# Patient Record
Sex: Female | Born: 1955 | Race: Black or African American | Hispanic: No | Marital: Single | Smoking: Never smoker
Health system: Southern US, Community
[De-identification: ages and names within clinical notes are randomized; demographics above are authoritative.]

## PROBLEM LIST (undated history)

## (undated) DIAGNOSIS — G629 Polyneuropathy, unspecified: Secondary | ICD-10-CM

## (undated) DIAGNOSIS — K859 Acute pancreatitis without necrosis or infection, unspecified: Secondary | ICD-10-CM

## (undated) DIAGNOSIS — I639 Cerebral infarction, unspecified: Secondary | ICD-10-CM

## (undated) DIAGNOSIS — E7800 Pure hypercholesterolemia, unspecified: Secondary | ICD-10-CM

## (undated) DIAGNOSIS — K219 Gastro-esophageal reflux disease without esophagitis: Secondary | ICD-10-CM

## (undated) DIAGNOSIS — E0590 Thyrotoxicosis, unspecified without thyrotoxic crisis or storm: Secondary | ICD-10-CM

## (undated) DIAGNOSIS — E119 Type 2 diabetes mellitus without complications: Secondary | ICD-10-CM

## (undated) HISTORY — PX: ABDOMINAL HYSTERECTOMY: SHX81

## (undated) HISTORY — PX: ABDOMINAL SURGERY: SHX537

## (undated) HISTORY — PX: TONSILLECTOMY: SUR1361

## (undated) NOTE — *Deleted (*Deleted)
   S:     No chief complaint on file.   Endocrinology provider: Spenser Beasley, FNP(upcoming appt 09/05/2020 11:45 AM)  Patient presents today with *** for tandem t:slim X2 insulin pump training. PMH significant for T1DM and eczema. Patient is currently using Dexcom G6 CGM. Patient reports taking Lantus 20 units andNovolog120/30/8plan. Basal injection was last admnistered ***. Patient {Reports/denies:60888} issues obtaining insulin pump from DME.  Update school care plan Dexcom - DME or pharmacy?  Insurance: Medicaid (Managed Medicaid ? ***)  DME Supplier: *** ?  Pump Settings *** Basal rates (max: *** unit/hr) 12a-12p 0.8 unit/hr  Carb Ratio (max: *** units) 12a-12p 8 g   Correction Factor Ratio 12a-12p 30 mg/dL   Target BG 12a-12p 120 mg/dL  Tandem T:Slim X2 Insulin Pump Education Training Please refer to Insulin Pump Training Checklist scanned into media  T:Connect Account: ***  Assessment: Parents appeared to have sufficient understanding of subjects discussed during Tandem t:slim X2 insulin pump training appt. Tandem t:slim X2 Insulin pump applied successfully to ***. Insulin pump was synced with Dexcom G6 CGM to use Control IQ technology***.   Plan: 1. Tandem T:Slim X2 Insulin Pump  a. Continue to wear Tandem T:Slim insulin pump and change infusion set site every 3 days b. Patient will have a temp basal rate set until *** c. Faxed central training invoice and insulin pump training checklist to [employer] 2. School: a. Updated school care plan to state patient is using Tandem insulin pump 3. Follow Up:  a. ***  Written patient instructions provided.    This appointment required *** minutes of patient care (this includes precharting, chart review, review of results, face-to-face care, etc.).  Thank you for involving clinical pharmacist/diabetes educator to assist in providing this patient's care.  Mary Taylor, PharmD, CPP  

---

## 2015-03-09 ENCOUNTER — Emergency Department (HOSPITAL_BASED_OUTPATIENT_CLINIC_OR_DEPARTMENT_OTHER)
Admission: EM | Admit: 2015-03-09 | Discharge: 2015-03-09 | Disposition: A | Discharge status: Home / Self Care | Payer: Self-pay | Attending: Emergency Medicine | Admitting: Emergency Medicine

## 2015-03-09 ENCOUNTER — Encounter (HOSPITAL_BASED_OUTPATIENT_CLINIC_OR_DEPARTMENT_OTHER): Payer: Self-pay | Admitting: *Deleted

## 2015-03-09 DIAGNOSIS — Z79899 Other long term (current) drug therapy: Secondary | ICD-10-CM | POA: Insufficient documentation

## 2015-03-09 DIAGNOSIS — Y939 Activity, unspecified: Secondary | ICD-10-CM | POA: Insufficient documentation

## 2015-03-09 DIAGNOSIS — N898 Other specified noninflammatory disorders of vagina: Secondary | ICD-10-CM | POA: Insufficient documentation

## 2015-03-09 DIAGNOSIS — Z880 Allergy status to penicillin: Secondary | ICD-10-CM | POA: Insufficient documentation

## 2015-03-09 DIAGNOSIS — L03115 Cellulitis of right lower limb: Secondary | ICD-10-CM | POA: Insufficient documentation

## 2015-03-09 DIAGNOSIS — Y999 Unspecified external cause status: Secondary | ICD-10-CM | POA: Insufficient documentation

## 2015-03-09 DIAGNOSIS — S91104A Unspecified open wound of right lesser toe(s) without damage to nail, initial encounter: Secondary | ICD-10-CM | POA: Insufficient documentation

## 2015-03-09 DIAGNOSIS — Y929 Unspecified place or not applicable: Secondary | ICD-10-CM | POA: Insufficient documentation

## 2015-03-09 DIAGNOSIS — S91119A Laceration without foreign body of unspecified toe without damage to nail, initial encounter: Secondary | ICD-10-CM | POA: Insufficient documentation

## 2015-03-09 DIAGNOSIS — Z8669 Personal history of other diseases of the nervous system and sense organs: Secondary | ICD-10-CM | POA: Insufficient documentation

## 2015-03-09 DIAGNOSIS — Z8719 Personal history of other diseases of the digestive system: Secondary | ICD-10-CM | POA: Insufficient documentation

## 2015-03-09 DIAGNOSIS — X58XXXA Exposure to other specified factors, initial encounter: Secondary | ICD-10-CM | POA: Insufficient documentation

## 2015-03-09 DIAGNOSIS — S91109A Unspecified open wound of unspecified toe(s) without damage to nail, initial encounter: Secondary | ICD-10-CM

## 2015-03-09 HISTORY — DX: Polyneuropathy, unspecified: G62.9

## 2015-03-09 HISTORY — DX: Gastro-esophageal reflux disease without esophagitis: K21.9

## 2015-03-09 LAB — CBG MONITORING, ED: Glucose-Capillary: 129 mg/dL — ABNORMAL HIGH (ref 70–99)

## 2015-03-09 MED ORDER — DOXYCYCLINE HYCLATE 100 MG PO CAPS
100.0000 mg | ORAL_CAPSULE | Freq: Two times a day (BID) | ORAL | Status: DC
Start: 2015-03-09 — End: 2015-03-09

## 2015-03-09 MED ORDER — FLUCONAZOLE 50 MG PO TABS
150.0000 mg | ORAL_TABLET | Freq: Once | ORAL | Status: AC
  Administered 2015-03-09: 150 mg via ORAL
  Filled 2015-03-09 (×2): qty 1

## 2015-03-09 MED ORDER — DOXYCYCLINE HYCLATE 100 MG PO CAPS: 100.0000 mg | ORAL_CAPSULE | Freq: Two times a day (BID) | ORAL | Status: DC

## 2015-03-09 MED ORDER — FLUCONAZOLE 150 MG PO TABS: ORAL_TABLET | ORAL | Status: DC

## 2015-03-09 NOTE — Discharge Instructions (Signed)
Keep your foot elevated. Apply antibiotic ointment to the laceration between her toes. Keep that area dry. Take doxycycline as prescribed until all gone. Follow-up with your doctor in 2-3 days for recheck. Return if rapidly worsening.   Cellulitis Cellulitis is an infection of the skin and the tissue beneath it. The infected area is usually red and tender. Cellulitis occurs most often in the arms and lower legs.  CAUSES  Cellulitis is caused by bacteria that enter the skin through cracks or cuts in the skin. The most common types of bacteria that cause cellulitis are staphylococci and streptococci. SIGNS AND SYMPTOMS   Redness and warmth.  Swelling.  Tenderness or pain.  Fever. DIAGNOSIS  Your health care provider can usually determine what is wrong based on a physical exam. Blood tests may also be done. TREATMENT  Treatment usually involves taking an antibiotic medicine. HOME CARE INSTRUCTIONS   Take your antibiotic medicine as directed by your health care provider. Finish the antibiotic even if you start to feel better.  Keep the infected arm or leg elevated to reduce swelling.  Apply a warm cloth to the affected area up to 4 times per day to relieve pain.  Take medicines only as directed by your health care provider.  Keep all follow-up visits as directed by your health care provider. SEEK MEDICAL CARE IF:   You notice red streaks coming from the infected area.  Your red area gets larger or turns dark in color.  Your bone or joint underneath the infected area becomes painful after the skin has healed.  Your infection returns in the same area or another area.  You notice a swollen bump in the infected area.  You develop new symptoms.  You have a fever. SEEK IMMEDIATE MEDICAL CARE IF:   You feel very sleepy.  You develop vomiting or diarrhea.  You have a general ill feeling (malaise) with muscle aches and pains. MAKE SURE YOU:   Understand these  instructions.  Will watch your condition.  Will get help right away if you are not doing well or get worse. Document Released: 08/06/2005 Document Revised: 03/13/2014 Document Reviewed: 01/12/2012 [facility] Patient Information 2015 [place], [place]. This information is not intended to replace advice given to you by your health care provider. Make sure you discuss any questions you have with your health care provider.

## 2015-03-09 NOTE — ED Provider Notes (Signed)
CSN: [id number]     Arrival date & time 03/09/15  1249 History   First MD Initiated Contact with Patient 03/09/15 1259     Chief Complaint  Patient presents with  . Toe Pain     (Consider location/radiation/quality/duration/timing/severity/associated sxs/prior Treatment) HPI Selena Long is a 59 y.o. female with hx of folliculitis, presents to ED with complaint of pain between her fourth and fifth toes and swelling to the foot. Patient states symptoms started approximately 3 days ago. States she noticed a cut between her toes. She has been putting more starch powder to the area. States today the foot swelling worsened and she is having pain to the foot and with walking. She denies any fever or chills. She states she is prediabetic, not on any diabetes medications currently. She denies any other associated symptoms. She denies any injuries. She denies any pain in the calf or upper leg. She denies any recent travel or surgeries. Patient also complaining of vaginal itching. She states she recently was treated for folliculitis with doxycycline, states she thinks she has ceased infection. She denies any pain. She admits to white vaginal discharge.   Past Medical History  Diagnosis Date  . Neuropathy   . Acid reflux    Past Surgical History  Procedure Laterality Date  . Abdominal hysterectomy    . Abdominal surgery    . Tonsillectomy     No family history on file. History  Substance Use Topics  . Smoking status: Never Smoker   . Smokeless tobacco: Not on file  . Alcohol Use: No   OB History    No data available     Review of Systems  Constitutional: Negative for fever and chills.  Respiratory: Negative for cough, chest tightness and shortness of breath.   Cardiovascular: Negative for chest pain, palpitations and leg swelling.  Genitourinary: Positive for vaginal discharge. Negative for dysuria, flank pain and pelvic pain.  Musculoskeletal: Positive for myalgias, joint swelling and  arthralgias. Negative for neck pain and neck stiffness.  Skin: Positive for wound. Negative for rash.  Neurological: Negative for dizziness, weakness and headaches.  All other systems reviewed and are negative.     Allergies  Penicillins  Home Medications   Prior to Admission medications   Medication Sig Start Date End Date Taking? Authorizing Provider  cyclobenzaprine (FLEXERIL) 10 MG tablet Take 10 mg by mouth 3 (three) times daily as needed for muscle spasms.   Yes Historical Provider, MD  docusate sodium (COLACE) 50 MG capsule Take 50 mg by mouth 2 (two) times daily.   Yes Historical Provider, MD  gabapentin (NEURONTIN) 800 MG tablet Take 800 mg by mouth 3 (three) times daily.   Yes Historical Provider, MD   BP 129/61 mmHg  Pulse 80  Temp(Src) 98.7 F (37.1 C) (Oral)  Resp 18  Ht  (1.549 m)  Wt 190 lb (86.183 kg)  BMI 35.92 kg/m2  SpO2 100% Physical Exam  Constitutional: She is oriented to person, place, and time. She appears well-developed and well-nourished. No distress.  HENT:  Head: Normocephalic.  Eyes: Conjunctivae are normal.  Neck: Neck supple.  Cardiovascular: Normal rate, regular rhythm and normal heart sounds.   Pulmonary/Chest: Effort normal and breath sounds normal. No respiratory distress. She has no wheezes. She has no rales.  Abdominal: Soft. Bowel sounds are normal. She exhibits no distension. There is no tenderness. There is no rebound.  Musculoskeletal: She exhibits no edema.  Swelling noted to the dorsal right foot.  There is tenderness to palpation over lateral dorsal foot mainly over fourth and fifth metatarsals. There is a small, 1 cm laceration between fourth and fifth toe. There is macerated, moist. No drainage. Foot is mildly erythematous and warm to the touch. Full range of motion of all toes at all joints. Ankles nontender with full range of motion.  Neurological: She is alert and oriented to person, place, and time.  Skin: Skin is warm and  dry.  Psychiatric: She has a normal mood and affect. Her behavior is normal.  Nursing note and vitals reviewed.   ED Course  Procedures (including critical care time) Labs Review Labs Reviewed  CBG MONITORING, ED - Abnormal; Notable for the following:    Glucose-Capillary 129 (*)    All other components within normal limits    Imaging Review No results found.   EKG Interpretation None      MDM   Final diagnoses:  Cellulitis of right foot  Wound, open, toe, initial encounter    Patient with a small wound to the right foot between fourth and fifth toes, most likely the source of foot cellulitis. She is afebrile, nontoxic appearing, no injuries to the foot. She is prediabetic, CBG here is 129. Will start on doxycycline for cellulitis. She is instructed to follow-up with her doctor in 2 days for recheck. Return precautions discussed.   Patient is also complaining of vaginal itching. Recent antibiotics. She refused pelvic exam, stated she just wanted to be treated for yeast infection. Diflucan 150mg  given. I explained to her that there may be possiblity of other things that could be causing her pelvic symptoms and I will not be able to diagnose those without an exam. Pt voiced understanding, and still refused pelvic exam.   Pt stable for d/c home at this time.   Filed Vitals:   03/09/15 1303  BP: 129/61  Pulse: 80  Temp: 98.7 F (37.1 C)  TempSrc: Oral  Resp: 18  Height: 5\' 1"  (1.549 m)  Weight: 190 lb (86.183 kg)  SpO2: 100%       Jaynie Crumbleatyana Joahan Swatzell, PA-C 03/09/15 1456  Gerhard Munchobert Lockwood, MD 03/09/15 1528

## 2015-03-09 NOTE — ED Notes (Signed)
Pt reports pain on her right foot between her 4th and 5th toes.  Ambulatory to triage.

## 2015-11-06 ENCOUNTER — Emergency Department (HOSPITAL_BASED_OUTPATIENT_CLINIC_OR_DEPARTMENT_OTHER): Payer: Self-pay

## 2015-11-06 ENCOUNTER — Encounter (HOSPITAL_BASED_OUTPATIENT_CLINIC_OR_DEPARTMENT_OTHER): Payer: Self-pay | Admitting: *Deleted

## 2015-11-06 ENCOUNTER — Emergency Department (HOSPITAL_BASED_OUTPATIENT_CLINIC_OR_DEPARTMENT_OTHER)
Admission: EM | Admit: 2015-11-06 | Discharge: 2015-11-07 | Disposition: A | Discharge status: Home / Self Care | Payer: Self-pay | Attending: Emergency Medicine | Admitting: Emergency Medicine

## 2015-11-06 DIAGNOSIS — K529 Noninfective gastroenteritis and colitis, unspecified: Secondary | ICD-10-CM | POA: Insufficient documentation

## 2015-11-06 DIAGNOSIS — Z880 Allergy status to penicillin: Secondary | ICD-10-CM | POA: Insufficient documentation

## 2015-11-06 DIAGNOSIS — K859 Acute pancreatitis without necrosis or infection, unspecified: Secondary | ICD-10-CM | POA: Insufficient documentation

## 2015-11-06 DIAGNOSIS — G629 Polyneuropathy, unspecified: Secondary | ICD-10-CM | POA: Insufficient documentation

## 2015-11-06 DIAGNOSIS — Z79899 Other long term (current) drug therapy: Secondary | ICD-10-CM | POA: Insufficient documentation

## 2015-11-06 LAB — URINALYSIS, ROUTINE W REFLEX MICROSCOPIC
BILIRUBIN URINE: NEGATIVE
Glucose, UA: >1000 mg/dL — AB
Ketones, ur: >80 mg/dL — AB
Leukocytes, UA: NEGATIVE
Nitrite: NEGATIVE
PH: 6 (ref 5.0–8.0)
Protein, ur: 30 mg/dL — AB
Specific Gravity, Urine: 1.031 — ABNORMAL HIGH (ref 1.005–1.030)

## 2015-11-06 LAB — I-STAT VENOUS BLOOD GAS, ED
Acid-Base Excess: 3 mmol/L — ABNORMAL HIGH (ref 0.0–2.0)
BICARBONATE: 26.7 meq/L — AB (ref 20.0–24.0)
O2 Saturation: 60 %
PCO2 VEN: 38.3 mmHg — AB (ref 45.0–50.0)
TCO2: 28 mmol/L (ref 0–100)
pH, Ven: 7.452 — ABNORMAL HIGH (ref 7.250–7.300)
pO2, Ven: 30 mmHg (ref 30.0–45.0)

## 2015-11-06 LAB — COMPREHENSIVE METABOLIC PANEL
ALBUMIN: 4.5 g/dL (ref 3.5–5.0)
ALK PHOS: 112 U/L (ref 38–126)
ALT: 18 U/L (ref 14–54)
AST: 22 U/L (ref 15–41)
Anion gap: 13 (ref 5–15)
BUN: 11 mg/dL (ref 6–20)
CALCIUM: 9.8 mg/dL (ref 8.9–10.3)
CHLORIDE: 100 mmol/L — AB (ref 101–111)
CO2: 23 mmol/L (ref 22–32)
Creatinine, Ser: 0.63 mg/dL (ref 0.44–1.00)
GFR calc Af Amer: >60 mL/min (ref >60)
GFR calc non Af Amer: >60 mL/min (ref >60)
GLUCOSE: 156 mg/dL — AB (ref 65–99)
Potassium: 3.4 mmol/L — ABNORMAL LOW (ref 3.5–5.1)
Sodium: 136 mmol/L (ref 135–145)
Total Bilirubin: 0.7 mg/dL (ref 0.3–1.2)
Total Protein: 8.9 g/dL — ABNORMAL HIGH (ref 6.5–8.1)

## 2015-11-06 LAB — CBC WITH DIFFERENTIAL/PLATELET
BASOS ABS: 0 10*3/uL (ref 0.0–0.1)
Basophils Relative: 0 %
EOS PCT: 0 %
Eosinophils Absolute: 0 10*3/uL (ref 0.0–0.7)
HEMATOCRIT: 43.8 % (ref 36.0–46.0)
HEMOGLOBIN: 15.3 g/dL — AB (ref 12.0–15.0)
LYMPHS ABS: 1.3 10*3/uL (ref 0.7–4.0)
LYMPHS PCT: 17 %
MCH: 30.7 pg (ref 26.0–34.0)
MCHC: 34.9 g/dL (ref 30.0–36.0)
MCV: 88 fL (ref 78.0–100.0)
Monocytes Absolute: 0.2 10*3/uL (ref 0.1–1.0)
Monocytes Relative: 3 %
NEUTROS ABS: 6.5 10*3/uL (ref 1.7–7.7)
NEUTROS PCT: 80 %
PLATELETS: 219 10*3/uL (ref 150–400)
RBC: 4.98 MIL/uL (ref 3.87–5.11)
RDW: 11.8 % (ref 11.5–15.5)
WBC: 8 10*3/uL (ref 4.0–10.5)

## 2015-11-06 LAB — LIPASE, BLOOD: Lipase: 207 U/L — ABNORMAL HIGH (ref 11–51)

## 2015-11-06 LAB — TROPONIN I

## 2015-11-06 LAB — URINE MICROSCOPIC-ADD ON: WBC, UA: NONE SEEN WBC/hpf (ref 0–5)

## 2015-11-06 MED ORDER — ONDANSETRON HCL 4 MG/2ML IJ SOLN
4.0000 mg | Freq: Once | INTRAMUSCULAR | Status: AC
  Administered 2015-11-06: 4 mg via INTRAVENOUS
  Filled 2015-11-06: qty 2

## 2015-11-06 MED ORDER — MORPHINE SULFATE (PF) 4 MG/ML IV SOLN
4.0000 mg | Freq: Once | INTRAVENOUS | Status: AC
  Administered 2015-11-06: 4 mg via INTRAVENOUS
  Filled 2015-11-06: qty 1

## 2015-11-06 MED ORDER — SODIUM CHLORIDE 0.9 % IV SOLN
1000.0000 mL | Freq: Once | INTRAVENOUS | Status: AC
  Administered 2015-11-06: 1000 mL via INTRAVENOUS

## 2015-11-06 MED ORDER — IOHEXOL 300 MG/ML  SOLN
100.0000 mL | Freq: Once | INTRAMUSCULAR | Status: AC | PRN
  Administered 2015-11-07: 100 mL via INTRAVENOUS

## 2015-11-06 MED ORDER — IOHEXOL 300 MG/ML  SOLN
25.0000 mL | Freq: Once | INTRAMUSCULAR | Status: AC | PRN
  Administered 2015-11-06: 25 mL via ORAL

## 2015-11-06 MED ORDER — SODIUM CHLORIDE 0.9 % IV BOLUS (SEPSIS)
1000.0000 mL | Freq: Once | INTRAVENOUS | Status: AC
  Administered 2015-11-06: 1000 mL via INTRAVENOUS

## 2015-11-06 MED ORDER — PANTOPRAZOLE SODIUM 40 MG IV SOLR
40.0000 mg | Freq: Once | INTRAVENOUS | Status: AC
  Administered 2015-11-06: 40 mg via INTRAVENOUS
  Filled 2015-11-06: qty 40

## 2015-11-06 NOTE — ED Notes (Signed)
MD at bedside. 

## 2015-11-06 NOTE — ED Notes (Signed)
Patient transported to CT 

## 2015-11-06 NOTE — ED Provider Notes (Signed)
CSN: [id number]     Arrival date & time 11/06/15  1613 History  By signing my name below, I, Doreatha Martin, attest that this documentation has been prepared under the direction and in the presence of Arby Barrette, MD. Electronically Signed: Doreatha Martin, ED Scribe. 11/06/2015. 7:15 PM.    Chief Complaint  Patient presents with  . Emesis   The history is provided by the patient. No language interpreter was used.    HPI Comments: Aqsa Sensabaugh is a 59 y.o. female with h/o borderline DM not medicated, acid reflux who presents to the Emergency Department complaining of moderate, intermittent emesis (>10x) and diarrhea (>10x) onset last night with associated nausea, orthostatic lightheadedness. Pt states h/o similar symptoms. She reports recent sick contact with a friend who has similar symptoms. Pt is not currently followed by a PCP.  She denies fever, abdominal pain, dysuria, hematuria, difficulty urinating, syncope, melena, hematochezia, hematemesis.     Past Medical History  Diagnosis Date  . Neuropathy (HCC)   . Acid reflux    Past Surgical History  Procedure Laterality Date  . Abdominal hysterectomy    . Abdominal surgery    . Tonsillectomy     History reviewed. No pertinent family history. Social History  Substance Use Topics  . Smoking status: Never Smoker   . Smokeless tobacco: None  . Alcohol Use: No   OB History    No data available     Review of Systems 10 Systems reviewed and are negative for acute change except as noted in the HPI.   Allergies  Penicillins  Home Medications   Prior to Admission medications   Medication Sig Start Date End Date Taking? Authorizing Provider  cyclobenzaprine (FLEXERIL) 10 MG tablet Take 10 mg by mouth 3 (three) times daily as needed for muscle spasms.    Historical Provider, MD  docusate sodium (COLACE) 50 MG capsule Take 50 mg by mouth 2 (two) times daily.    Historical Provider, MD  doxycycline (VIBRAMYCIN) 100 MG capsule Take 1  capsule (100 mg total) by mouth 2 (two) times daily. 03/09/15   Tatyana Kirichenko, PA-C  fluconazole (DIFLUCAN) 150 MG tablet Take 1 tablet once after finish all of the antibiotics 03/09/15   Tatyana Kirichenko, PA-C  gabapentin (NEURONTIN) 800 MG tablet Take 800 mg by mouth 3 (three) times daily.    Historical Provider, MD  ondansetron (ZOFRAN ODT) 4 MG disintegrating tablet Take 1 tablet (4 mg total) by mouth every 4 (four) hours as needed for nausea or vomiting. 11/07/15   Arby Barrette, MD  oxyCODONE-acetaminophen (PERCOCET) 5-325 MG tablet Take 1-2 tablets by mouth every 6 (six) hours as needed. 11/07/15   Arby Barrette, MD   BP 152/83 mmHg  Pulse 89  Temp(Src) 98.1 F (36.7 C) (Oral)  Resp 16  Ht  (1.549 m)  Wt 190 lb (86.183 kg)  BMI 35.92 kg/m2  SpO2 100% Physical Exam  Constitutional: She is oriented to person, place, and time. She appears well-developed and well-nourished.  HENT:  Head: Normocephalic and atraumatic.  Eyes: Conjunctivae and EOM are normal. Pupils are equal, round, and reactive to light.  Neck: Normal range of motion. Neck supple.  Cardiovascular: Normal rate, regular rhythm, normal heart sounds and intact distal pulses.   Pulmonary/Chest: Effort normal and breath sounds normal. No respiratory distress. She has no wheezes. She has no rales.  Abdominal: Soft. She exhibits no distension. There is tenderness.  Patient has discomfort to palpation in the epigastrium and  central abdomen. Low abdomen is nontender.  Musculoskeletal: Normal range of motion. She exhibits no edema.  Neurological: She is alert and oriented to person, place, and time.  Skin: Skin is warm and dry.  Psychiatric: She has a normal mood and affect. Her behavior is normal.  Nursing note and vitals reviewed.   ED Course  Procedures (including critical care time) DIAGNOSTIC STUDIES: Oxygen Saturation is 100% on RA, normal by my interpretation.    COORDINATION OF CARE: 7:14 PM Discussed  treatment plan with pt at bedside which includes lab work and pt agreed to plan.   Labs Review Labs Reviewed  URINALYSIS, ROUTINE W REFLEX MICROSCOPIC (NOT AT [facility]) - Abnormal; Notable for the following:    Specific Gravity, Urine 1.031 (*)    Glucose, UA >1000 (*)    Hgb urine dipstick MODERATE (*)    Ketones, ur >80 (*)    Protein, ur 30 (*)    All other components within normal limits  URINE MICROSCOPIC-ADD ON - Abnormal; Notable for the following:    Squamous Epithelial / LPF 0-5 (*)    Bacteria, UA RARE (*)    All other components within normal limits  COMPREHENSIVE METABOLIC PANEL - Abnormal; Notable for the following:    Potassium 3.4 (*)    Chloride 100 (*)    Glucose, Bld 156 (*)    Total Protein 8.9 (*)    All other components within normal limits  LIPASE, BLOOD - Abnormal; Notable for the following:    Lipase 207 (*)    All other components within normal limits  CBC WITH DIFFERENTIAL/PLATELET - Abnormal; Notable for the following:    Hemoglobin 15.3 (*)    All other components within normal limits  I-STAT VENOUS BLOOD GAS, ED - Abnormal; Notable for the following:    pH, Ven 7.452 (*)    pCO2, Ven 38.3 (*)    Bicarbonate 26.7 (*)    Acid-Base Excess 3.0 (*)    All other components within normal limits  TROPONIN I  BLOOD GAS, VENOUS  I have personally reviewed and evaluated these lab results as part of my medical decision-making.   EKG Interpretation  Date/Time:  Tuesday November 06 2015 19:27:34 EST Ventricular Rate:  93 PR Interval:  182 QRS Duration: 88 QT Interval:  372 QTC Calculation: 462 R Axis:   -33 Text Interpretation:  Normal sinus rhythm Left axis deviation Abnormal ECG Confirmed by Donnald GarrePfeiffer, MD, Lebron ConnersMarcy [phone] on 11/06/2015 7:58:04 PM         MDM   Final diagnoses:  Gastroenteritis  Acute pancreatitis, unspecified pancreatitis type   Patient felt significantly improved after rehydration and treatment of her pain. Predominant symptoms are  for gastroenteritis with recurrent episodes of vomiting and diarrhea. Mildly elevated lipase. CT does not show acute inflammatory changes. Patient is given information for pancreatitis and gastroenteritis. She is treated for both pain and nausea with Percocet and Zofran. She is discharged with a family member family member. Return instructions are provided.  Arby BarretteMarcy Lyndia Bury, MD 11/07/15 [phone]

## 2015-11-06 NOTE — ED Notes (Signed)
Pt c/o n/v/d x 2 days 

## 2015-11-07 MED ORDER — OXYCODONE-ACETAMINOPHEN 5-325 MG PO TABS: 1.0000 | ORAL_TABLET | Freq: Four times a day (QID) | ORAL | Status: DC | PRN

## 2015-11-07 MED ORDER — ONDANSETRON 4 MG PO TBDP: 4.0000 mg | ORAL_TABLET | ORAL | Status: DC | PRN

## 2015-11-07 NOTE — Discharge Instructions (Signed)
Viral Gastroenteritis Viral gastroenteritis is also known as stomach flu. This condition affects the stomach and intestinal tract. It can cause sudden diarrhea and vomiting. The illness typically lasts 3 to 8 days. Most people develop an immune response that eventually gets rid of the virus. While this natural response develops, the virus can make you quite ill. CAUSES  Many different viruses can cause gastroenteritis, such as rotavirus or noroviruses. You can catch one of these viruses by consuming contaminated food or water. You may also catch a virus by sharing utensils or other personal items with an infected person or by touching a contaminated surface. SYMPTOMS  The most common symptoms are diarrhea and vomiting. These problems can cause a severe loss of body fluids (dehydration) and a body salt (electrolyte) imbalance. Other symptoms may include:  Fever.  Headache.  Fatigue.  Abdominal pain. DIAGNOSIS  Your caregiver can usually diagnose viral gastroenteritis based on your symptoms and a physical exam. A stool sample may also be taken to test for the presence of viruses or other infections. TREATMENT  This illness typically goes away on its own. Treatments are aimed at rehydration. The most serious cases of viral gastroenteritis involve vomiting so severely that you are not able to keep fluids down. In these cases, fluids must be given through an intravenous line (IV). HOME CARE INSTRUCTIONS   Drink enough fluids to keep your urine clear or pale yellow. Drink small amounts of fluids frequently and increase the amounts as tolerated.  Ask your caregiver for specific rehydration instructions.  Avoid:  Foods high in sugar.  Alcohol.  Carbonated drinks.  Tobacco.  Juice.  Caffeine drinks.  Extremely hot or cold fluids.  Fatty, greasy foods.  Too much intake of anything at one time.  Dairy products until 24 to 48 hours after diarrhea stops.  You may consume probiotics.  Probiotics are active cultures of beneficial bacteria. They may lessen the amount and number of diarrheal stools in adults. Probiotics can be found in yogurt with active cultures and in supplements.  Wash your hands well to avoid spreading the virus.  Only take over-the-counter or prescription medicines for pain, discomfort, or fever as directed by your caregiver. Do not give aspirin to children. Antidiarrheal medicines are not recommended.  Ask your caregiver if you should continue to take your regular prescribed and over-the-counter medicines.  Keep all follow-up appointments as directed by your caregiver. SEEK IMMEDIATE MEDICAL CARE IF:   You are unable to keep fluids down.  You do not urinate at least once every 6 to 8 hours.  You develop shortness of breath.  You notice blood in your stool or vomit. This may look like coffee grounds.  You have abdominal pain that increases or is concentrated in one small area (localized).  You have persistent vomiting or diarrhea.  You have a fever.  The patient is a child younger than 3 months, and he or she has a fever.  The patient is a child older than 3 months, and he or she has a fever and persistent symptoms.  The patient is a child older than 3 months, and he or she has a fever and symptoms suddenly get worse.  The patient is a baby, and he or she has no tears when crying. MAKE SURE YOU:   Understand these instructions.  Will watch your condition.  Will get help right away if you are not doing well or get worse.   This information is not intended to replace  advice given to you by your health care provider. Make sure you discuss any questions you have with your health care provider.   Document Released: 10/27/2005 Document Revised: 01/19/2012 Document Reviewed: 08/13/2011 Elsevier Interactive Patient Education 2016 [employer].  Mild Acute Pancreatitis Acute pancreatitis is a disease in which the pancreas becomes suddenly  inflamed. The pancreas is a large gland located behind your stomach. The pancreas produces enzymes that help digest food. The pancreas also releases the hormones glucagon and insulin that help regulate blood sugar. Damage to the pancreas occurs when the digestive enzymes from the pancreas are activated and begin attacking the pancreas before being released into the intestine. Most acute attacks last a couple of days and can cause serious complications. Some people become dehydrated and develop low blood pressure. In severe cases, bleeding into the pancreas can lead to shock and can be life-threatening. The lungs, heart, and kidneys may fail. CAUSES  Pancreatitis can happen to anyone. In some cases, the cause is unknown. Most cases are caused by:  Alcohol abuse.  Gallstones. Other less common causes are:  Certain medicines.  Exposure to certain chemicals.  Infection.  Damage caused by an accident (trauma).  Abdominal surgery. SYMPTOMS   Pain in the upper abdomen that may radiate to the back.  Tenderness and swelling of the abdomen.  Nausea and vomiting. DIAGNOSIS  Your caregiver will perform a physical exam. Blood and stool tests may be done to confirm the diagnosis. Imaging tests may also be done, such as X-rays, CT scans, or an ultrasound of the abdomen. TREATMENT  Treatment usually requires a stay in the hospital. Treatment may include:  Pain medicine.  Fluid replacement through an intravenous line (IV).  Placing a tube in the stomach to remove stomach contents and control vomiting.  Not eating for 3 or 4 days. This gives your pancreas a rest, because enzymes are not being produced that can cause further damage.  Antibiotic medicines if your condition is caused by an infection.  Surgery of the pancreas or gallbladder. HOME CARE INSTRUCTIONS   Follow the diet advised by your caregiver. This may involve avoiding alcohol and decreasing the amount of fat in your diet.  Eat  smaller, more frequent meals. This reduces the amount of digestive juices the pancreas produces.  Drink enough fluids to keep your urine clear or pale yellow.  Only take over-the-counter or prescription medicines as directed by your caregiver.  Avoid drinking alcohol if it caused your condition.  Do not smoke.  Get plenty of rest.  Check your blood sugar at home as directed by your caregiver.  Keep all follow-up appointments as directed by your caregiver. SEEK MEDICAL CARE IF:   You do not recover as quickly as expected.  You develop new or worsening symptoms.  You have persistent pain, weakness, or nausea.  You recover and then have another episode of pain. SEEK IMMEDIATE MEDICAL CARE IF:   You are unable to eat or keep fluids down.  Your pain becomes severe.  You have a fever or persistent symptoms for more than 2 to 3 days.  You have a fever and your symptoms suddenly get worse.  Your skin or the white part of your eyes turn yellow (jaundice).  You develop vomiting.  You feel dizzy, or you faint.  Your blood sugar is high (over 300 mg/dL). MAKE SURE YOU:   Understand these instructions.  Will watch your condition.  Will get help right away if you are not doing  well or get worse.   This information is not intended to replace advice given to you by your health care provider. Make sure you discuss any questions you have with your health care provider.   Document Released: 10/27/2005 Document Revised: 04/27/2012 Document Reviewed: 02/05/2012 Elsevier Interactive Patient Education [employer].

## 2015-11-07 NOTE — ED Notes (Signed)
MD at bedside discussing test results and dispo plan of care. 

## 2016-03-12 ENCOUNTER — Encounter (HOSPITAL_BASED_OUTPATIENT_CLINIC_OR_DEPARTMENT_OTHER): Payer: Self-pay | Admitting: Emergency Medicine

## 2016-03-12 ENCOUNTER — Emergency Department (HOSPITAL_BASED_OUTPATIENT_CLINIC_OR_DEPARTMENT_OTHER)
Admission: EM | Admit: 2016-03-12 | Discharge: 2016-03-12 | Disposition: A | Discharge status: Home / Self Care | Payer: Self-pay | Attending: Emergency Medicine | Admitting: Emergency Medicine

## 2016-03-12 DIAGNOSIS — R61 Generalized hyperhidrosis: Secondary | ICD-10-CM | POA: Insufficient documentation

## 2016-03-12 DIAGNOSIS — K858 Other acute pancreatitis without necrosis or infection: Secondary | ICD-10-CM | POA: Insufficient documentation

## 2016-03-12 HISTORY — DX: Acute pancreatitis without necrosis or infection, unspecified: K85.90

## 2016-03-12 LAB — COMPREHENSIVE METABOLIC PANEL
ALK PHOS: 113 U/L (ref 38–126)
ALT: 15 U/L (ref 14–54)
AST: 20 U/L (ref 15–41)
Albumin: 4.2 g/dL (ref 3.5–5.0)
Anion gap: 9 (ref 5–15)
BILIRUBIN TOTAL: 0.4 mg/dL (ref 0.3–1.2)
BUN: 14 mg/dL (ref 6–20)
CALCIUM: 9.3 mg/dL (ref 8.9–10.3)
CO2: 25 mmol/L (ref 22–32)
CREATININE: 0.73 mg/dL (ref 0.44–1.00)
Chloride: 105 mmol/L (ref 101–111)
GFR calc Af Amer: >60 mL/min (ref >60)
GLUCOSE: 189 mg/dL — AB (ref 65–99)
POTASSIUM: 3.6 mmol/L (ref 3.5–5.1)
Sodium: 139 mmol/L (ref 135–145)
TOTAL PROTEIN: 8.2 g/dL — AB (ref 6.5–8.1)

## 2016-03-12 LAB — ETHANOL: Alcohol, Ethyl (B): <5 mg/dL (ref <5)

## 2016-03-12 LAB — URINALYSIS, ROUTINE W REFLEX MICROSCOPIC
BILIRUBIN URINE: NEGATIVE
Glucose, UA: >1000 mg/dL — AB
Hgb urine dipstick: NEGATIVE
KETONES UR: NEGATIVE mg/dL
LEUKOCYTES UA: NEGATIVE
NITRITE: NEGATIVE
Protein, ur: NEGATIVE mg/dL
Specific Gravity, Urine: 1.021 (ref 1.005–1.030)
pH: 8 (ref 5.0–8.0)

## 2016-03-12 LAB — CBC
HCT: 44.7 % (ref 36.0–46.0)
Hemoglobin: 15.7 g/dL — ABNORMAL HIGH (ref 12.0–15.0)
MCH: 31.2 pg (ref 26.0–34.0)
MCHC: 35.1 g/dL (ref 30.0–36.0)
MCV: 88.7 fL (ref 78.0–100.0)
PLATELETS: 229 10*3/uL (ref 150–400)
RBC: 5.04 MIL/uL (ref 3.87–5.11)
RDW: 13.3 % (ref 11.5–15.5)
WBC: 9.4 10*3/uL (ref 4.0–10.5)

## 2016-03-12 LAB — URINE MICROSCOPIC-ADD ON

## 2016-03-12 LAB — LIPASE, BLOOD: Lipase: 56 U/L — ABNORMAL HIGH (ref 11–51)

## 2016-03-12 MED ORDER — ONDANSETRON HCL 4 MG/2ML IJ SOLN
4.0000 mg | Freq: Once | INTRAMUSCULAR | Status: AC | PRN
  Administered 2016-03-12: 4 mg via INTRAVENOUS
  Filled 2016-03-12: qty 2

## 2016-03-12 MED ORDER — OXYCODONE-ACETAMINOPHEN 5-325 MG PO TABS
2.0000 | ORAL_TABLET | Freq: Once | ORAL | Status: AC
  Administered 2016-03-12: 2 via ORAL
  Filled 2016-03-12: qty 2

## 2016-03-12 MED ORDER — SODIUM CHLORIDE 0.9 % IV BOLUS (SEPSIS)
1000.0000 mL | Freq: Once | INTRAVENOUS | Status: AC
  Administered 2016-03-12: 1000 mL via INTRAVENOUS

## 2016-03-12 MED ORDER — ONDANSETRON 4 MG PO TBDP: 4.0000 mg | ORAL_TABLET | ORAL | Status: DC | PRN

## 2016-03-12 NOTE — ED Provider Notes (Signed)
CSN: [id number]     Arrival date & time 03/12/16  0747 History   First MD Initiated Contact with Patient 03/12/16 [id number]     Chief Complaint  Patient presents with  . Abdominal Pain     (Consider location/radiation/quality/duration/timing/severity/associated sxs/prior Treatment) Patient is a 60 y.o. female presenting with abdominal pain. The history is provided by the patient.  Abdominal Pain Pain location:  Generalized Pain quality: aching and fullness   Pain radiates to:  Does not radiate Pain severity:  Mild Onset quality:  Gradual Duration:  12 hours Timing:  Constant Chronicity:  New Context: not alcohol use, not recent illness and not retching   Associated symptoms: nausea and vomiting   Associated symptoms: no diarrhea     Past Medical History  Diagnosis Date  . Neuropathy (HCC)   . Acid reflux   . Pancreatitis    Past Surgical History  Procedure Laterality Date  . Abdominal hysterectomy    . Abdominal surgery    . Tonsillectomy     No family history on file. Social History  Substance Use Topics  . Smoking status: Never Smoker   . Smokeless tobacco: None  . Alcohol Use: No   OB History    No data available     Review of Systems  Gastrointestinal: Positive for nausea, vomiting and abdominal pain. Negative for diarrhea.  All other systems reviewed and are negative.     Allergies  Penicillins  Home Medications   Prior to Admission medications   Medication Sig Start Date End Date Taking? Authorizing Provider  cyclobenzaprine (FLEXERIL) 10 MG tablet Take 10 mg by mouth 3 (three) times daily as needed for muscle spasms.    Historical Provider, MD  docusate sodium (COLACE) 50 MG capsule Take 50 mg by mouth 2 (two) times daily.    Historical Provider, MD  doxycycline (VIBRAMYCIN) 100 MG capsule Take 1 capsule (100 mg total) by mouth 2 (two) times daily. 03/09/15   Tatyana Kirichenko, PA-C  fluconazole (DIFLUCAN) 150 MG tablet Take 1 tablet once after finish  all of the antibiotics 03/09/15   Tatyana Kirichenko, PA-C  gabapentin (NEURONTIN) 800 MG tablet Take 800 mg by mouth 3 (three) times daily.    Historical Provider, MD  ondansetron (ZOFRAN ODT) 4 MG disintegrating tablet Take 1 tablet (4 mg total) by mouth every 4 (four) hours as needed for nausea or vomiting. 03/12/16   Marily MemosJason Tracia Lacomb, MD  oxyCODONE-acetaminophen (PERCOCET) 5-325 MG tablet Take 1-2 tablets by mouth every 6 (six) hours as needed. 11/07/15   Arby BarretteMarcy Pfeiffer, MD   BP 154/94 mmHg  Pulse 69  Temp(Src) 98.1 F (36.7 C) (Oral)  Resp 20  Ht 5' 1.5" (1.562 m)  Wt 185 lb (83.915 kg)  BMI 34.39 kg/m2  SpO2 99% Physical Exam  Constitutional: She appears well-developed and well-nourished.  HENT:  Head: Normocephalic and atraumatic.  Neck: Normal range of motion.  Cardiovascular: Normal rate and regular rhythm.   Pulmonary/Chest: No stridor. No respiratory distress.  Abdominal: Soft. She exhibits no distension. There is tenderness. There is no rebound and no guarding.  Neurological: She is alert.  Skin: She is diaphoretic (slightly).  Nursing note and vitals reviewed.   ED Course  Procedures (including critical care time) Labs Review Labs Reviewed  LIPASE, BLOOD - Abnormal; Notable for the following:    Lipase 56 (*)    All other components within normal limits  COMPREHENSIVE METABOLIC PANEL - Abnormal; Notable for the following:    Glucose,  Bld 189 (*)    Total Protein 8.2 (*)    All other components within normal limits  CBC - Abnormal; Notable for the following:    Hemoglobin 15.7 (*)    All other components within normal limits  URINALYSIS, ROUTINE W REFLEX MICROSCOPIC (NOT AT [facility]) - Abnormal; Notable for the following:    Glucose, UA >1000 (*)    All other components within normal limits  URINE MICROSCOPIC-ADD ON - Abnormal; Notable for the following:    Squamous Epithelial / LPF 0-5 (*)    Bacteria, UA FEW (*)    All other components within normal limits  ETHANOL     Imaging Review No results found. I have personally reviewed and evaluated these images and lab results as part of my medical decision-making.   EKG Interpretation None      MDM   Final diagnoses:  Other acute pancreatitis   Pancreatitis v gastroenteritis. Will eval and treat appropriately.   Slightly elevated lipase, likely related to alcohol use last night. Tolerating PO. Will continue fluid hydration and reeval for likely discharge.   Patients symptoms improved with fluids but still with some pain. Will give percocet. Still no peritonitis. Low suspicion for surgical causes, no indication for further workup or management unless symptoms worsen.   New Prescriptions: Discharge Medication List as of 03/12/2016 10:28 AM     I have personally and contemperaneously reviewed labs and imaging and used in my decision making as above.   A medical screening exam was performed and I feel the patient has had an appropriate workup for their chief complaint at this time and likelihood of emergent condition existing is low and thus workup can continue on an outpatient basis.. Their vital signs are stable. They have been counseled on decision, discharge, follow up and which symptoms necessitate immediate return to the emergency department.  They verbally stated understanding and agreement with plan and discharged in stable condition.      Marily Memos, MD 03/12/16 1239

## 2016-03-12 NOTE — ED Notes (Signed)
Family member at bedside.

## 2016-03-12 NOTE — ED Notes (Signed)
MD at bedside. 

## 2016-03-12 NOTE — ED Notes (Signed)
Abdominal pain with N/V since last night.  History of pancreatitis.  Last drink last night.

## 2016-03-12 NOTE — ED Notes (Addendum)
Pt is up for discharge, family at bedside and now she states her abdomen is killing her.  Pt has been asleep the entire visit with no complaints with no vomiting.  Informed dr. Clayborne DanaMesner.

## 2016-03-12 NOTE — ED Notes (Signed)
Pt resting quietly. Eyes closed. Rise and fall of chest noted. 

## 2016-05-20 ENCOUNTER — Emergency Department (HOSPITAL_BASED_OUTPATIENT_CLINIC_OR_DEPARTMENT_OTHER): Payer: Self-pay

## 2016-05-20 ENCOUNTER — Emergency Department (HOSPITAL_BASED_OUTPATIENT_CLINIC_OR_DEPARTMENT_OTHER)
Admission: EM | Admit: 2016-05-20 | Discharge: 2016-05-21 | Disposition: A | Discharge status: Home / Self Care | Payer: Self-pay | Attending: Emergency Medicine | Admitting: Emergency Medicine

## 2016-05-20 ENCOUNTER — Encounter (HOSPITAL_BASED_OUTPATIENT_CLINIC_OR_DEPARTMENT_OTHER): Payer: Self-pay

## 2016-05-20 DIAGNOSIS — M25512 Pain in left shoulder: Secondary | ICD-10-CM | POA: Insufficient documentation

## 2016-05-20 DIAGNOSIS — Z79899 Other long term (current) drug therapy: Secondary | ICD-10-CM | POA: Insufficient documentation

## 2016-05-20 DIAGNOSIS — Y999 Unspecified external cause status: Secondary | ICD-10-CM | POA: Insufficient documentation

## 2016-05-20 DIAGNOSIS — S0990XA Unspecified injury of head, initial encounter: Secondary | ICD-10-CM | POA: Insufficient documentation

## 2016-05-20 DIAGNOSIS — W19XXXA Unspecified fall, initial encounter: Secondary | ICD-10-CM

## 2016-05-20 DIAGNOSIS — E119 Type 2 diabetes mellitus without complications: Secondary | ICD-10-CM | POA: Insufficient documentation

## 2016-05-20 DIAGNOSIS — M25562 Pain in left knee: Secondary | ICD-10-CM | POA: Insufficient documentation

## 2016-05-20 DIAGNOSIS — Y939 Activity, unspecified: Secondary | ICD-10-CM | POA: Insufficient documentation

## 2016-05-20 DIAGNOSIS — W01198A Fall on same level from slipping, tripping and stumbling with subsequent striking against other object, initial encounter: Secondary | ICD-10-CM | POA: Insufficient documentation

## 2016-05-20 DIAGNOSIS — Y929 Unspecified place or not applicable: Secondary | ICD-10-CM | POA: Insufficient documentation

## 2016-05-20 HISTORY — DX: Thyrotoxicosis, unspecified without thyrotoxic crisis or storm: E05.90

## 2016-05-20 HISTORY — DX: Type 2 diabetes mellitus without complications: E11.9

## 2016-05-20 HISTORY — DX: Pure hypercholesterolemia, unspecified: E78.00

## 2016-05-20 MED ORDER — HYDROCODONE-ACETAMINOPHEN 5-325 MG PO TABS: 1.0000 | ORAL_TABLET | Freq: Four times a day (QID) | ORAL | Status: DC | PRN

## 2016-05-20 MED ORDER — HYDROCODONE-ACETAMINOPHEN 5-325 MG PO TABS
2.0000 | ORAL_TABLET | Freq: Once | ORAL | Status: AC
  Administered 2016-05-20: 2 via ORAL
  Filled 2016-05-20: qty 2

## 2016-05-20 MED ORDER — IBUPROFEN 800 MG PO TABS
800.0000 mg | ORAL_TABLET | Freq: Once | ORAL | Status: AC
  Administered 2016-05-20: 800 mg via ORAL
  Filled 2016-05-20: qty 1

## 2016-05-20 NOTE — ED Notes (Signed)
When giving pt her medications, she states she is not sure if she is supposed to take naproxen because it makes her reflux worse and that she might not be able to take ibuprofen.  Pt took all the medication anyway, and Dr. Elesa MassedWard is aware.  Pt complained that the water was too hot that the nurse brought her, so nurse brought ice water.  Pt then complained that the ice water tasted like bleach.  Nurse asked if she would like something else and pt asked for ginger ale instead and nurse brought ginger ale.  Radiology waiting at bedside to take pt to xray.

## 2016-05-20 NOTE — ED Notes (Signed)
Pt returned from xray

## 2016-05-20 NOTE — Discharge Instructions (Signed)
Head Injury, Adult °You have received a head injury. It does not appear serious at this time. Headaches and vomiting are common following head injury. It should be easy to awaken from sleeping. Sometimes it is necessary for you to stay in the emergency department for a while for observation. Sometimes admission to the hospital may be needed. After injuries such as yours, most problems occur within the first 24 hours, but side effects may occur up to 7-10 days after the injury. It is important for you to carefully monitor your condition and contact your health care provider or seek immediate medical care if there is a change in your condition. °WHAT ARE THE TYPES OF HEAD INJURIES? °Head injuries can be as minor as a bump. Some head injuries can be more severe. More severe head injuries include: °· A jarring injury to the brain (concussion). °· A bruise of the brain (contusion). This mean there is bleeding in the brain that can cause swelling. °· A cracked skull (skull fracture). °· Bleeding in the brain that collects, clots, and forms a bump (hematoma). °WHAT CAUSES A HEAD INJURY? °A serious head injury is most likely to happen to someone who is in a car wreck and is not wearing a seat belt. Other causes of major head injuries include bicycle or motorcycle accidents, sports injuries, and falls. °HOW ARE HEAD INJURIES DIAGNOSED? °A complete history of the event leading to the injury and your current symptoms will be helpful in diagnosing head injuries. Many times, pictures of the brain, such as CT or MRI are needed to see the extent of the injury. Often, an overnight hospital stay is necessary for observation.  °WHEN SHOULD I SEEK IMMEDIATE MEDICAL CARE?  °You should get help right away if: °· You have confusion or drowsiness. °· You feel sick to your stomach (nauseous) or have continued, forceful vomiting. °· You have dizziness or unsteadiness that is getting worse. °· You have severe, continued headaches not  relieved by medicine. Only take over-the-counter or prescription medicines for pain, fever, or discomfort as directed by your health care provider. °· You do not have normal function of the arms or legs or are unable to walk. °· You notice changes in the black spots in the center of the colored part of your eye (pupil). °· You have a clear or bloody fluid coming from your nose or ears. °· You have a loss of vision. °During the next 24 hours after the injury, you must stay with someone who can watch you for the warning signs. This person should contact local emergency services (911 in the [place] if you have seizures, you become unconscious, or you are unable to wake up. °HOW CAN I PREVENT A HEAD INJURY IN THE FUTURE? °The most important factor for preventing major head injuries is avoiding motor vehicle accidents.  To minimize the potential for damage to your head, it is crucial to wear seat belts while riding in motor vehicles. Wearing helmets while bike riding and playing collision sports (like football) is also helpful. Also, avoiding dangerous activities around the house will further help reduce your risk of head injury.  °WHEN CAN I RETURN TO NORMAL ACTIVITIES AND ATHLETICS? °You should be reevaluated by your health care provider before returning to these activities. If you have any of the following symptoms, you should not return to activities or contact sports until 1 week after the symptoms have stopped: °· Persistent headache. °· Dizziness or vertigo. °· Poor attention and concentration. °· Confusion. °·   Memory problems.  Nausea or vomiting.  Fatigue or tire easily.  Irritability.  Intolerant of bright lights or loud noises.  Anxiety or depression.  Disturbed sleep. MAKE SURE YOU:   Understand these instructions.  Will watch your condition.  Will get help right away if you are not doing well or get worse.   This information is not intended to replace advice given to you by your health  care provider. Make sure you discuss any questions you have with your health care provider.   Document Released: 10/27/2005 Document Revised: 11/17/2014 Document Reviewed: 07/04/2013 Elsevier Interactive Patient Education 2016 Elsevier Inc[employer]   Contusion A contusion is a deep bruise. Contusions are the result of a blunt injury to tissues and muscle fibers under the skin. The injury causes bleeding under the skin. The skin overlying the contusion may turn blue, purple, or yellow. Minor injuries will give you a painless contusion, but more severe contusions may stay painful and swollen for a few weeks.  CAUSES  This condition is usually caused by a blow, trauma, or direct force to an area of the body. SYMPTOMS  Symptoms of this condition include:  Swelling of the injured area.  Pain and tenderness in the injured area.  Discoloration. The area may have redness and then turn blue, purple, or yellow. DIAGNOSIS  This condition is diagnosed based on a physical exam and medical history. An X-ray, CT scan, or MRI may be needed to determine if there are any associated injuries, such as broken bones (fractures). TREATMENT  Specific treatment for this condition depends on what area of the body was injured. In general, the best treatment for a contusion is resting, icing, applying pressure to (compression), and elevating the injured area. This is often called the RICE strategy. Over-the-counter anti-inflammatory medicines may also be recommended for pain control.  HOME CARE INSTRUCTIONS   Rest the injured area.  If directed, apply ice to the injured area:  Put ice in a plastic bag.  Place a towel between your skin and the bag.  Leave the ice on for 20 minutes, 2-3 times per day.  If directed, apply light compression to the injured area using an elastic bandage. Make sure the bandage is not wrapped too tightly. Remove and reapply the bandage as directed by your health care provider.  If  possible, raise (elevate) the injured area above the level of your heart while you are sitting or lying down.  Take over-the-counter and prescription medicines only as told by your health care provider. SEEK MEDICAL CARE IF:  Your symptoms do not improve after several days of treatment.  Your symptoms get worse.  You have difficulty moving the injured area. SEEK IMMEDIATE MEDICAL CARE IF:   You have severe pain.  You have numbness in a hand or foot.  Your hand or foot turns pale or cold.   This information is not intended to replace advice given to you by your health care provider. Make sure you discuss any questions you have with your health care provider.   Document Released: 08/06/2005 Document Revised: 07/18/2015 Document Reviewed: 03/14/2015 Elsevier Interactive Patient Education 2016 Elsevier Inc.   RICE for Routine Care of Injuries Theroutine careofmanyinjuriesincludes rest, ice, compression, and elevation (RICE therapy). RICE therapy is often recommended for injuries to soft tissues, such as a muscle strain, ligament injuries, bruises, and overuse injuries. It can also be used for some bony injuries. Using RICE therapy can help to relieve pain, lessen swelling, and enable your  body to heal. Rest Rest is required to allow your body to heal. This usually involves reducing your normal activities and avoiding use of the injured part of your body. Generally, you can return to your normal activities when you are comfortable and have been given permission by your health care provider. Ice Icing your injury helps to keep the swelling down, and it lessens pain. Do not apply ice directly to your skin.  Put ice in a plastic bag.  Place a towel between your skin and the bag.  Leave the ice on for 20 minutes, 2-3 times a day. Do this for as long as you are directed by your health care provider. Compression Compression means putting pressure on the injured area. Compression  helps to keep swelling down, gives support, and helps with discomfort. Compression may be done with an elastic bandage. If an elastic bandage has been applied, follow these general tips:  Remove and reapply the bandage every 3-4 hours or as directed by your health care provider.  Make sure the bandage is not wrapped too tightly, because this can cut off circulation. If part of your body beyond the bandage becomes blue, numb, cold, swollen, or more painful, your bandage is most likely too tight. If this occurs, remove your bandage and reapply it more loosely.  See your health care provider if the bandage seems to be making your problems worse rather than better. Elevation Elevation means keeping the injured area raised. This helps to lessen swelling and decrease pain. If possible, your injured area should be elevated at or above the level of your heart or the center of your chest. WHEN SHOULD I SEEK MEDICAL CARE? You should seek medical care if:  Your pain and swelling continue.  Your symptoms are getting worse rather than improving. These symptoms may indicate that further evaluation or further X-rays are needed. Sometimes, X-rays may not show a small broken bone (fracture) until a number of days later. Make a follow-up appointment with your health care provider. WHEN SHOULD I SEEK IMMEDIATE MEDICAL CARE? You should seek immediate medical care if:  You have sudden severe pain at or below the area of your injury.  You have redness or increased swelling around your injury.  You have tingling or numbness at or below the area of your injury that does not improve after you remove the elastic bandage.   This information is not intended to replace advice given to you by your health care provider. Make sure you discuss any questions you have with your health care provider.   Document Released: 02/08/2001 Document Revised: 07/18/2015 Document Reviewed: 10/04/2014 Elsevier Interactive Patient  Education [employer] Elsevier Inc[employer]

## 2016-05-20 NOTE — ED Provider Notes (Signed)
By signing my name below, I, Rosario AdieWilliam Andrew Hiatt, attest that this documentation has been prepared under the direction and in the presence of Suzie Vandam N Kyler Germer, DO.  Electronically Signed: Rosario AdieWilliam Andrew Hiatt, ED Scribe. 05/20/2016. 11:42 PM.  TIME SEEN: 11:15PM  CHIEF COMPLAINT:  Chief Complaint  Patient presents with  . Fall   HPI: HPI Comments: Selena Long is a 60 y.o. female with a PMHx of fibromyagia, GERD, DM, hyperthyroidism, HLD, and neuropathy who presents to the Emergency Department complaining of sudden onset, gradually worsening, constant left knee and left shoulder pain s/p fall that occurred 1 day ago. Pt reports that she was in her basement when she tripped and landed with the left side of her body against a brick wall. She did strike her head during the incident but she denies LOC. She has had an associated throbbing headache since the accident, but notes that it has resolved while in the ED. Pt took Gabapentin and Aleve PTA with no relief of her new pain. She notes that her left knee has been buckling and she has pain with ambulation. Pt is not on anticoagulants. Pt denies unilateral extremity numbness/tingling/weakness, chest pain, nausea, vomiting, abdominal pain, Neck or back pain or any other symptoms.   ROS: See HPI Constitutional: no fever  Eyes: no drainage  ENT: no runny nose   Cardiovascular:  no chest pain  Resp: no SOB  GI: no vomiting GU: no dysuria Integumentary: no rash  Allergy: no hives  Musculoskeletal: no leg swelling  Neurological: no slurred speech ROS otherwise negative  PAST MEDICAL HISTORY/PAST SURGICAL HISTORY:  Past Medical History  Diagnosis Date  . Neuropathy (HCC)   . Acid reflux   . Pancreatitis   . Diabetes mellitus without complication (HCC)   . Hyperthyroidism   . High cholesterol     MEDICATIONS:  Prior to Admission medications   Medication Sig Start Date End Date Taking? Authorizing Provider  Calcium Carbonate-Vitamin D  (CALCIUM 600+D HIGH POTENCY) 600-400 MG-UNIT tablet Take 1 tablet by mouth daily.   Yes Historical Provider, MD  cholecalciferol (VITAMIN D) 1000 units tablet Take 1,000 Units by mouth daily.   Yes Historical Provider, MD  cyclobenzaprine (FLEXERIL) 10 MG tablet Take 10 mg by mouth 3 (three) times daily as needed for muscle spasms.   Yes Historical Provider, MD  docusate sodium (COLACE) 50 MG capsule Take 50 mg by mouth 2 (two) times daily.   Yes Historical Provider, MD  gabapentin (NEURONTIN) 800 MG tablet Take 800 mg by mouth 3 (three) times daily.   Yes Historical Provider, MD  loratadine (CLARITIN) 10 MG tablet Take 10 mg by mouth daily.   Yes Historical Provider, MD  methimazole (TAPAZOLE) 5 MG tablet Take 5 mg by mouth daily.   Yes Historical Provider, MD  nortriptyline (PAMELOR) 10 MG capsule Take 10 mg by mouth at bedtime.   Yes Historical Provider, MD  simvastatin (ZOCOR) 20 MG tablet Take 20 mg by mouth at bedtime.   Yes Historical Provider, MD    ALLERGIES:  Allergies  Allergen Reactions  . Penicillins Hives    SOCIAL HISTORY:  Social History  Substance Use Topics  . Smoking status: Never Smoker   . Smokeless tobacco: Not on file  . Alcohol Use: Yes     Comment: occ    FAMILY HISTORY: No family history on file.  EXAM: BP 118/67 mmHg  Pulse 82  Temp(Src) 98.7 F (37.1 C) (Oral)  Resp 20  Ht 5' 1.5" (  1.562 m)  Wt 184 lb 7 oz (83.66 kg)  BMI 34.29 kg/m2  SpO2 99%   CONSTITUTIONAL: Alert and oriented and responds appropriately to questions. Well-appearing; well-nourished; GCS 15 HEAD: Normocephalic; atraumatic EYES: Conjunctivae clear, PERRL, EOMI ENT: normal nose; no rhinorrhea; moist mucous membranes; pharynx without lesions noted; no dental injury; no septal hematoma NECK: Supple, no meningismus, no LAD; no midline spinal tenderness, step-off or deformity CARD: RRR; S1 and S2 appreciated; no murmurs, no clicks, no rubs, no gallops RESP: Normal chest excursion  without splinting or tachypnea; breath sounds clear and equal bilaterally; no wheezes, no rhonchi, no rales; no hypoxia or respiratory distress CHEST:  chest wall stable, no crepitus or ecchymosis or deformity, nontender to palpation ABD/GI: Normal bowel sounds; non-distended; soft, non-tender, no rebound, no guarding PELVIS:  stable, nontender to palpation BACK:  The back appears normal and is non-tender to palpation, there is no CVA tenderness; no midline spinal tenderness, step-off or deformity [id number] over the left shoulder and left anterior knee w/o bony deformity or joint effusion. No ligamentous laxity appreciated. Full ROM in all joints; no edema; normal capillary refill; no cyanosis, otherwise extremities are nontender to palpation, compartments are soft, no joint effusions, no ecchymosis or lacerations; 2+ radial and DP pulses bilaterally   SKIN: Normal color for age and race; warm NEURO: Moves all extremities equally, sensation to light touch intact diffusely, cranial nerves II through XII intact PSYCH: The patient's mood and manner are appropriate. Grooming and personal hygiene are appropriate.  MEDICAL DECISION MAKING: Patient here with mechanical fall. X-rays of her left shoulder and knee are unremarkable. We'll put her in a knee sleeve for comfort and give crutches to use as needed. She is requesting something for her arm but I do not feel a sling as indicated discussed with patient that this could put her at risk for adhesive capsulitis. I do not feel she needs emergent imaging of her head. She is neurologically intact and only complains of mild headache intermittently. Not on anticoagulation, antiplatelets. No sign of any skull fracture on exam. Have discussed with her head injury return precautions. Discussed rest, elevation and ice of her extremities. We'll discharge with prescription for pain medication.  At this time, I do not feel there is any life-threatening condition  present. I have reviewed and discussed all results (EKG, imaging, lab, urine as appropriate), exam findings with patient. I have reviewed nursing notes and appropriate previous records.  I feel the patient is safe to be discharged home without further emergent workup. Discussed usual and customary return precautions. Patient and family (if present) verbalize understanding and are comfortable with this plan.  Patient will follow-up with their primary care provider. If they do not have a primary care provider, information for follow-up has been provided to them. All questions have been answered.   I personally performed the services described in this documentation, which was scribed in my presence. The recorded information has been reviewed and is accurate.     Layla Maw Jamon [place], DO 05/21/16 [id number]

## 2016-05-20 NOTE — ED Notes (Signed)
Pt states she tripped and fell against the wall in her basement yesterday.  She is c/o left knee pain, left upper arm pain and left side head pain.  No visible bruising or swelling or abrasions, pt states she cannot ambulate on left leg.

## 2016-05-20 NOTE — ED Notes (Signed)
Patient transported to X-ray 

## 2016-05-20 NOTE — ED Notes (Signed)
Tripped/fell yesterday-c/o pain to "my whole left side"-hit left side of head-?slight area to left temporal area-states bleeding yesterday-none at present

## 2016-05-21 NOTE — ED Notes (Signed)
Pt verbalizes understanding of d/c instructions and denies any further needs at this time. 

## 2016-06-06 ENCOUNTER — Emergency Department (HOSPITAL_BASED_OUTPATIENT_CLINIC_OR_DEPARTMENT_OTHER)
Admission: EM | Admit: 2016-06-06 | Discharge: 2016-06-06 | Disposition: A | Discharge status: Home / Self Care | Payer: Self-pay | Attending: Emergency Medicine | Admitting: Emergency Medicine

## 2016-06-06 ENCOUNTER — Encounter (HOSPITAL_BASED_OUTPATIENT_CLINIC_OR_DEPARTMENT_OTHER): Payer: Self-pay | Admitting: Emergency Medicine

## 2016-06-06 ENCOUNTER — Emergency Department (HOSPITAL_BASED_OUTPATIENT_CLINIC_OR_DEPARTMENT_OTHER): Payer: Self-pay

## 2016-06-06 DIAGNOSIS — E119 Type 2 diabetes mellitus without complications: Secondary | ICD-10-CM | POA: Insufficient documentation

## 2016-06-06 DIAGNOSIS — K449 Diaphragmatic hernia without obstruction or gangrene: Secondary | ICD-10-CM | POA: Insufficient documentation

## 2016-06-06 DIAGNOSIS — R112 Nausea with vomiting, unspecified: Secondary | ICD-10-CM

## 2016-06-06 LAB — CBC WITH DIFFERENTIAL/PLATELET
Basophils Absolute: 0 10*3/uL (ref 0.0–0.1)
Basophils Relative: 0 %
EOS ABS: 0 10*3/uL (ref 0.0–0.7)
EOS PCT: 0 %
HCT: 43.5 % (ref 36.0–46.0)
HEMOGLOBIN: 15.3 g/dL — AB (ref 12.0–15.0)
LYMPHS ABS: 1.2 10*3/uL (ref 0.7–4.0)
Lymphocytes Relative: 13 %
MCH: 31.4 pg (ref 26.0–34.0)
MCHC: 35.2 g/dL (ref 30.0–36.0)
MCV: 89.1 fL (ref 78.0–100.0)
MONO ABS: 0.2 10*3/uL (ref 0.1–1.0)
MONOS PCT: 2 %
Neutro Abs: 7.8 10*3/uL — ABNORMAL HIGH (ref 1.7–7.7)
Neutrophils Relative %: 85 %
PLATELETS: 228 10*3/uL (ref 150–400)
RBC: 4.88 MIL/uL (ref 3.87–5.11)
RDW: 13.7 % (ref 11.5–15.5)
WBC: 9.2 10*3/uL (ref 4.0–10.5)

## 2016-06-06 LAB — COMPREHENSIVE METABOLIC PANEL
ALBUMIN: 4.7 g/dL (ref 3.5–5.0)
ALK PHOS: 116 U/L (ref 38–126)
ALT: 18 U/L (ref 14–54)
AST: 24 U/L (ref 15–41)
Anion gap: 12 (ref 5–15)
BILIRUBIN TOTAL: 0.6 mg/dL (ref 0.3–1.2)
BUN: 7 mg/dL (ref 6–20)
CALCIUM: 9.9 mg/dL (ref 8.9–10.3)
CO2: 25 mmol/L (ref 22–32)
Chloride: 98 mmol/L — ABNORMAL LOW (ref 101–111)
Creatinine, Ser: 0.61 mg/dL (ref 0.44–1.00)
GFR calc Af Amer: >60 mL/min (ref >60)
GFR calc non Af Amer: >60 mL/min (ref >60)
GLUCOSE: 171 mg/dL — AB (ref 65–99)
Potassium: 3.3 mmol/L — ABNORMAL LOW (ref 3.5–5.1)
SODIUM: 135 mmol/L (ref 135–145)
TOTAL PROTEIN: 9.2 g/dL — AB (ref 6.5–8.1)

## 2016-06-06 LAB — URINALYSIS, ROUTINE W REFLEX MICROSCOPIC
BILIRUBIN URINE: NEGATIVE
KETONES UR: 15 mg/dL — AB
Leukocytes, UA: NEGATIVE
Nitrite: NEGATIVE
PH: 7.5 (ref 5.0–8.0)
Protein, ur: 30 mg/dL — AB
Specific Gravity, Urine: 1.043 — ABNORMAL HIGH (ref 1.005–1.030)

## 2016-06-06 LAB — URINE MICROSCOPIC-ADD ON: WBC UA: NONE SEEN WBC/hpf (ref 0–5)

## 2016-06-06 LAB — ETHANOL: Alcohol, Ethyl (B): <5 mg/dL (ref <5)

## 2016-06-06 LAB — LIPASE, BLOOD: Lipase: 41 U/L (ref 11–51)

## 2016-06-06 MED ORDER — ONDANSETRON HCL 4 MG/2ML IJ SOLN
4.0000 mg | Freq: Once | INTRAMUSCULAR | Status: AC
  Administered 2016-06-06: 4 mg via INTRAVENOUS
  Filled 2016-06-06: qty 2

## 2016-06-06 MED ORDER — METOCLOPRAMIDE HCL 5 MG/ML IJ SOLN
10.0000 mg | Freq: Once | INTRAMUSCULAR | Status: AC
  Administered 2016-06-06: 10 mg via INTRAMUSCULAR
  Filled 2016-06-06: qty 2

## 2016-06-06 MED ORDER — PROMETHAZINE HCL 25 MG/ML IJ SOLN
25.0000 mg | Freq: Once | INTRAMUSCULAR | Status: AC
  Administered 2016-06-06: 25 mg via INTRAVENOUS
  Filled 2016-06-06: qty 1

## 2016-06-06 MED ORDER — SODIUM CHLORIDE 0.9 % IV BOLUS (SEPSIS)
1000.0000 mL | Freq: Once | INTRAVENOUS | Status: AC
  Administered 2016-06-06: 1000 mL via INTRAVENOUS

## 2016-06-06 MED ORDER — MORPHINE SULFATE (PF) 4 MG/ML IV SOLN
4.0000 mg | Freq: Once | INTRAVENOUS | Status: AC
Start: 2016-06-06 — End: 2016-06-06
  Administered 2016-06-06: 4 mg via INTRAVENOUS
  Filled 2016-06-06: qty 1

## 2016-06-06 MED ORDER — IOPAMIDOL (ISOVUE-300) INJECTION 61%
100.0000 mL | Freq: Once | INTRAVENOUS | Status: AC | PRN
  Administered 2016-06-06: 100 mL via INTRAVENOUS

## 2016-06-06 MED ORDER — PROMETHAZINE HCL 25 MG PO TABS: 25.0000 mg | ORAL_TABLET | Freq: Four times a day (QID) | ORAL | 0 refills | Status: AC | PRN

## 2016-06-06 MED ORDER — FAMOTIDINE 20 MG PO TABS: 20.0000 mg | ORAL_TABLET | Freq: Two times a day (BID) | ORAL | 0 refills | Status: AC

## 2016-06-06 MED ORDER — GI COCKTAIL ~~LOC~~: 30.0000 mL | Freq: Once | ORAL | Status: DC

## 2016-06-06 MED ORDER — FAMOTIDINE IN NACL 20-0.9 MG/50ML-% IV SOLN
20.0000 mg | Freq: Once | INTRAVENOUS | Status: AC
  Administered 2016-06-06: 20 mg via INTRAVENOUS
  Filled 2016-06-06: qty 50

## 2016-06-06 NOTE — ED Notes (Signed)
Selena Long, did the assessment on this pt  Not [name]

## 2016-06-06 NOTE — ED Notes (Signed)
After all preparations for DC were done pt became nauseated again and vomited.  MD notified.  Orders received.

## 2016-06-06 NOTE — ED Provider Notes (Signed)
[facility] Provider Note   CSN: [id number] Arrival date & time: 06/06/16  1455  First Provider Contact:  First MD Initiated Contact with Patient 06/06/16 1524        History   Chief Complaint No chief complaint on file.   HPI Selena Long is a 60 y.o. female.  Pt presents to the ED today with abdominal pain.  The pt said that she has a hx of pancreatitis and this feels similar.  She has had n/v also.  The pt denies any f/c.   The history is provided by the patient.    Past Medical History:  Diagnosis Date  . Acid reflux   . Diabetes mellitus without complication (HCC)   . High cholesterol   . Hyperthyroidism   . Neuropathy (HCC)   . Pancreatitis     There are no active problems to display for this patient.   Past Surgical History:  Procedure Laterality Date  . ABDOMINAL HYSTERECTOMY    . ABDOMINAL SURGERY    . TONSILLECTOMY      OB History    No data available       Home Medications    Prior to Admission medications   Medication Sig Start Date End Date Taking? Authorizing Provider  Calcium Carbonate-Vitamin D (CALCIUM 600+D HIGH POTENCY) 600-400 MG-UNIT tablet Take 1 tablet by mouth daily.    Historical Provider, MD  cholecalciferol (VITAMIN D) 1000 units tablet Take 1,000 Units by mouth daily.    Historical Provider, MD  cyclobenzaprine (FLEXERIL) 10 MG tablet Take 10 mg by mouth 3 (three) times daily as needed for muscle spasms.    Historical Provider, MD  docusate sodium (COLACE) 50 MG capsule Take 50 mg by mouth 2 (two) times daily.    Historical Provider, MD  famotidine (PEPCID) 20 MG tablet Take 1 tablet (20 mg total) by mouth 2 (two) times daily. 06/06/16   Jacalyn Lefevre, MD  gabapentin (NEURONTIN) 800 MG tablet Take 800 mg by mouth 3 (three) times daily.    Historical Provider, MD  HYDROcodone-acetaminophen (NORCO/VICODIN) 5-325 MG tablet Take 1-2 tablets by mouth every 6 (six) hours as needed. 05/20/16   Kristen N Ward, DO  loratadine  (CLARITIN) 10 MG tablet Take 10 mg by mouth daily.    Historical Provider, MD  methimazole (TAPAZOLE) 5 MG tablet Take 5 mg by mouth daily.    Historical Provider, MD  nortriptyline (PAMELOR) 10 MG capsule Take 10 mg by mouth at bedtime.    Historical Provider, MD  promethazine (PHENERGAN) 25 MG tablet Take 1 tablet (25 mg total) by mouth every 6 (six) hours as needed for nausea or vomiting. 06/06/16   Jacalyn Lefevre, MD  simvastatin (ZOCOR) 20 MG tablet Take 20 mg by mouth at bedtime.    Historical Provider, MD    Family History No family history on file.  Social History Social History  Substance Use Topics  . Smoking status: Never Smoker  . Smokeless tobacco: Not on file  . Alcohol use Yes     Comment: occ     Allergies   Penicillins   Review of Systems Review of Systems  Gastrointestinal: Positive for abdominal pain, nausea and vomiting.  All other systems reviewed and are negative.    Physical Exam Updated Vital Signs BP 128/80 (BP Location: Left Arm)   Pulse 74   Temp 99.5 F (37.5 C)   Resp 16   Ht 5\' 1"  (1.549 m)   Wt 184 lb 7  oz (83.7 kg)   SpO2 98%   BMI 34.85 kg/m   Physical Exam  Constitutional: She is oriented to person, place, and time. She appears well-developed and well-nourished.  HENT:  Head: Normocephalic and atraumatic.  Right Ear: External ear normal.  Left Ear: External ear normal.  Nose: Nose normal.  Mouth/Throat: Oropharynx is clear and moist.  Eyes: Conjunctivae and EOM are normal. Pupils are equal, round, and reactive to light.  Neck: Normal range of motion. Neck supple.  Cardiovascular: Normal rate, regular rhythm, normal heart sounds and intact distal pulses.   Pulmonary/Chest: Effort normal and breath sounds normal.  Abdominal: Soft. Bowel sounds are normal. There is tenderness in the epigastric area.  Musculoskeletal: Normal range of motion.  Neurological: She is alert and oriented to person, place, and time.  Skin: Skin is warm  and dry.  Psychiatric: She has a normal mood and affect. Her behavior is normal. Judgment and thought content normal.  Nursing note and vitals reviewed.    ED Treatments / Results  Labs (all labs ordered are listed, but only abnormal results are displayed) Labs Reviewed  CBC WITH DIFFERENTIAL/PLATELET - Abnormal; Notable for the following:       Result Value   Hemoglobin 15.3 (*)    Neutro Abs 7.8 (*)    All other components within normal limits  COMPREHENSIVE METABOLIC PANEL - Abnormal; Notable for the following:    Potassium 3.3 (*)    Chloride 98 (*)    Glucose, Bld 171 (*)    Total Protein 9.2 (*)    All other components within normal limits  URINALYSIS, ROUTINE W REFLEX MICROSCOPIC (NOT AT [facility]) - Abnormal; Notable for the following:    Specific Gravity, Urine 1.043 (*)    Glucose, UA >1000 (*)    Hgb urine dipstick MODERATE (*)    Ketones, ur 15 (*)    Protein, ur 30 (*)    All other components within normal limits  URINE MICROSCOPIC-ADD ON - Abnormal; Notable for the following:    Squamous Epithelial / LPF 0-5 (*)    Bacteria, UA RARE (*)    All other components within normal limits  LIPASE, BLOOD  ETHANOL    EKG  EKG Interpretation None       Radiology Ct Abdomen Pelvis W Contrast  Result Date: 06/06/2016 CLINICAL DATA:  60 year old female with acute abdominal and pelvic pain. EXAM: CT ABDOMEN AND PELVIS WITH CONTRAST TECHNIQUE: Multidetector CT imaging of the abdomen and pelvis was performed using the standard protocol following bolus administration of intravenous contrast. CONTRAST:  ISOVUE-300 IOPAMIDOL (ISOVUE-300) INJECTION 61% COMPARISON:  11/06/2015 CT a FINDINGS: Lower chest:  A moderate to large hiatal hernia is again noted. Hepatobiliary: The liver and gallbladder are unremarkable. There is no evidence of biliary dilatation. Pancreas: Unremarkable Spleen: Unremarkable Adrenals/Urinary Tract: The kidneys, adrenal glands and bladder are  unremarkable. Stomach/Bowel: Colonic diverticulosis noted without evidence of diverticulitis. There is no evidence of bowel obstruction or definite bowel wall thickening. Vascular/Lymphatic: Unremarkable. No abdominal aortic aneurysm or enlarged lymph nodes. Reproductive: The patient is status post hysterectomy. No adnexal masses identified. Other: No free fluid, abscess or pneumoperitoneum. Musculoskeletal: No acute or suspicious abnormality. IMPRESSION: No acute abnormality. Moderate to large hiatal hernia. Electronically Signed   By: Harmon Pier M.D.   On: 06/06/2016 17:11   Procedures Procedures (including critical care time)  Medications Ordered in ED Medications  sodium chloride 0.9 % bolus 1,000 mL (0 mLs Intravenous Stopped 06/06/16 1758)  morphine 4 MG/ML injection 4 mg (4 mg Intravenous Given 06/06/16 1547)  ondansetron (ZOFRAN) injection 4 mg (4 mg Intravenous Given 06/06/16 1547)  iopamidol (ISOVUE-300) 61 % injection 100 mL (100 mLs Intravenous Contrast Given 06/06/16 1631)  promethazine (PHENERGAN) injection 25 mg (25 mg Intravenous Given 06/06/16 1757)  famotidine (PEPCID) IVPB 20 mg premix (0 mg Intravenous Stopped 06/06/16 1924)  metoCLOPramide (REGLAN) injection 10 mg (10 mg Intramuscular Given 06/06/16 1943)     Initial Impression / Assessment and Plan / ED Course  I have reviewed the triage vital signs and the nursing notes.  Pertinent labs & imaging results that were available during my care of the patient were reviewed by me and considered in my medical decision making (see chart for details).  Clinical Course   Pt tried some ice chips after she received zofran and threw up.  She received pepcid and phenergan and sx have much improved.  She is able to tolerate pos.  She is given the number to GI and will be d/c'd with phenergan and pepcid.   Final Clinical Impressions(s) / ED Diagnoses   Final diagnoses:  Hiatal hernia  Non-intractable vomiting with nausea, vomiting of  unspecified type    New Prescriptions Discharge Medication List as of 06/06/2016  7:02 PM    START taking these medications   Details  famotidine (PEPCID) 20 MG tablet Take 1 tablet (20 mg total) by mouth 2 (two) times daily., Starting Fri 06/06/2016, Print    promethazine (PHENERGAN) 25 MG tablet Take 1 tablet (25 mg total) by mouth every 6 (six) hours as needed for nausea or vomiting., Starting Fri 06/06/2016, Print         Jacalyn Lefevre, MD 06/06/16 2103

## 2016-06-06 NOTE — ED Notes (Signed)
Pt drank Ginger Ale and had no problems.

## 2016-06-06 NOTE — ED Notes (Signed)
Kalirose Hedrich (pt's daughter) will be back. Contact # [phone]

## 2016-06-06 NOTE — ED Notes (Signed)
Pt. Just returned from radiology  

## 2016-06-06 NOTE — ED Notes (Signed)
Have asked Pt. For urine but she reports she is unable to give sample.

## 2016-12-20 ENCOUNTER — Encounter (HOSPITAL_BASED_OUTPATIENT_CLINIC_OR_DEPARTMENT_OTHER): Payer: Self-pay | Admitting: *Deleted

## 2016-12-20 ENCOUNTER — Emergency Department (HOSPITAL_BASED_OUTPATIENT_CLINIC_OR_DEPARTMENT_OTHER): Payer: Self-pay

## 2016-12-20 ENCOUNTER — Emergency Department (HOSPITAL_BASED_OUTPATIENT_CLINIC_OR_DEPARTMENT_OTHER)
Admission: EM | Admit: 2016-12-20 | Discharge: 2016-12-20 | Disposition: A | Discharge status: Home / Self Care | Payer: Self-pay | Attending: Emergency Medicine | Admitting: Emergency Medicine

## 2016-12-20 DIAGNOSIS — E119 Type 2 diabetes mellitus without complications: Secondary | ICD-10-CM | POA: Insufficient documentation

## 2016-12-20 DIAGNOSIS — R51 Headache: Secondary | ICD-10-CM | POA: Insufficient documentation

## 2016-12-20 DIAGNOSIS — R69 Illness, unspecified: Secondary | ICD-10-CM

## 2016-12-20 DIAGNOSIS — R509 Fever, unspecified: Secondary | ICD-10-CM | POA: Insufficient documentation

## 2016-12-20 DIAGNOSIS — J111 Influenza due to unidentified influenza virus with other respiratory manifestations: Secondary | ICD-10-CM

## 2016-12-20 DIAGNOSIS — M791 Myalgia: Secondary | ICD-10-CM | POA: Insufficient documentation

## 2016-12-20 DIAGNOSIS — R05 Cough: Secondary | ICD-10-CM | POA: Insufficient documentation

## 2016-12-20 MED ORDER — BENZONATATE 100 MG PO CAPS: 100.0000 mg | ORAL_CAPSULE | Freq: Three times a day (TID) | ORAL | 0 refills | Status: DC

## 2016-12-20 MED ORDER — ALBUTEROL SULFATE HFA 108 (90 BASE) MCG/ACT IN AERS
2.0000 | INHALATION_SPRAY | RESPIRATORY_TRACT | Status: DC | PRN
  Administered 2016-12-20: 2 via RESPIRATORY_TRACT
  Filled 2016-12-20: qty 6.7

## 2016-12-20 MED ORDER — PREDNISONE 50 MG PO TABS
60.0000 mg | ORAL_TABLET | Freq: Once | ORAL | Status: AC
  Administered 2016-12-20: 60 mg via ORAL
  Filled 2016-12-20: qty 1

## 2016-12-20 MED ORDER — PREDNISONE 20 MG PO TABS: 20.0000 mg | ORAL_TABLET | Freq: Two times a day (BID) | ORAL | 0 refills | Status: DC

## 2016-12-20 MED ORDER — BENZONATATE 100 MG PO CAPS
100.0000 mg | ORAL_CAPSULE | Freq: Once | ORAL | Status: AC
  Administered 2016-12-20: 100 mg via ORAL
  Filled 2016-12-20: qty 1

## 2016-12-20 MED ORDER — OSELTAMIVIR PHOSPHATE 75 MG PO CAPS: 75.0000 mg | ORAL_CAPSULE | Freq: Two times a day (BID) | ORAL | 0 refills | Status: DC

## 2016-12-20 NOTE — ED Notes (Signed)
ED Provider at bedside. 

## 2016-12-20 NOTE — ED Provider Notes (Signed)
[facility]   CSN: [id number] Arrival date & time: 12/20/16  1526   By signing my name below, I, Soijett Blue, attest that this documentation has been prepared under the direction and in the presence of Rolland Porter, MD. Electronically Signed: Soijett Blue, ED Scribe. 12/20/16. 6:05 PM.  History   Chief Complaint Chief Complaint  Patient presents with  . Cough    HPI Selena Long is a 61 y.o. female with a PMHx of DM, who presents to the Emergency Department complaining of productive cough onset 4 days ago. Pt reports associated chills, HA, sweats, nausea, decreased PO intake, and generalized body aches. Pt has tried aleve with no relief of her symptoms. Pt denies vomiting and any other symptoms. Pt denies obtaining her flu vaccination this past year. Pt notes that she is allergic to penicillin.    The history is provided by the patient. No language interpreter was used.    Past Medical History:  Diagnosis Date  . Acid reflux   . Diabetes mellitus without complication (HCC)   . High cholesterol   . Hyperthyroidism   . Neuropathy (HCC)   . Pancreatitis     There are no active problems to display for this patient.   Past Surgical History:  Procedure Laterality Date  . ABDOMINAL HYSTERECTOMY    . ABDOMINAL SURGERY    . TONSILLECTOMY      OB History    No data available       Home Medications    Prior to Admission medications   Medication Sig Start Date End Date Taking? Authorizing Provider  benzonatate (TESSALON) 100 MG capsule Take 1 capsule (100 mg total) by mouth every 8 (eight) hours. 12/20/16   Rolland Porter, MD  Calcium Carbonate-Vitamin D (CALCIUM 600+D HIGH POTENCY) 600-400 MG-UNIT tablet Take 1 tablet by mouth daily.    Historical Provider, MD  cholecalciferol (VITAMIN D) 1000 units tablet Take 1,000 Units by mouth daily.    Historical Provider, MD  cyclobenzaprine (FLEXERIL) 10 MG tablet Take 10 mg by mouth 3 (three) times daily as  needed for muscle spasms.    Historical Provider, MD  docusate sodium (COLACE) 50 MG capsule Take 50 mg by mouth 2 (two) times daily.    Historical Provider, MD  famotidine (PEPCID) 20 MG tablet Take 1 tablet (20 mg total) by mouth 2 (two) times daily. 06/06/16   Jacalyn Lefevre, MD  gabapentin (NEURONTIN) 800 MG tablet Take 800 mg by mouth 3 (three) times daily.    Historical Provider, MD  HYDROcodone-acetaminophen (NORCO/VICODIN) 5-325 MG tablet Take 1-2 tablets by mouth every 6 (six) hours as needed. 05/20/16   Kristen N Ward, DO  loratadine (CLARITIN) 10 MG tablet Take 10 mg by mouth daily.    Historical Provider, MD  methimazole (TAPAZOLE) 5 MG tablet Take 5 mg by mouth daily.    Historical Provider, MD  nortriptyline (PAMELOR) 10 MG capsule Take 10 mg by mouth at bedtime.    Historical Provider, MD  oseltamivir (TAMIFLU) 75 MG capsule Take 1 capsule (75 mg total) by mouth every 12 (twelve) hours. 12/20/16   Rolland Porter, MD  predniSONE (DELTASONE) 20 MG tablet Take 1 tablet (20 mg total) by mouth 2 (two) times daily with a meal. 12/20/16   Rolland Porter, MD  promethazine (PHENERGAN) 25 MG tablet Take 1 tablet (25 mg total) by mouth every 6 (six) hours as needed for nausea or vomiting. 06/06/16   Jacalyn Lefevre, MD  simvastatin [facility])  20 MG tablet Take 20 mg by mouth at bedtime.    Historical Provider, MD    Family History History reviewed. No pertinent family history.  Social History Social History  Substance Use Topics  . Smoking status: Never Smoker  . Smokeless tobacco: Not on file  . Alcohol use Yes     Comment: occ     Allergies   Penicillins   Review of Systems Review of Systems  Constitutional: Positive for appetite change, chills and fever. Negative for diaphoresis and fatigue.  HENT: Negative for mouth sores, sore throat and trouble swallowing.   Eyes: Negative for visual disturbance.  Respiratory: Positive for cough. Negative for chest tightness, shortness of breath and  wheezing.   Cardiovascular: Negative for chest pain.  Gastrointestinal: Negative for abdominal distention, abdominal pain, diarrhea, nausea and vomiting.  Endocrine: Negative for polydipsia, polyphagia and polyuria.  Genitourinary: Negative for dysuria, frequency and hematuria.  Musculoskeletal: Positive for myalgias. Negative for gait problem.  Skin: Negative for color change, pallor and rash.  Neurological: Positive for headaches. Negative for dizziness, syncope and light-headedness.  Hematological: Does not bruise/bleed easily.  Psychiatric/Behavioral: Negative for behavioral problems and confusion.     Physical Exam Updated Vital Signs BP 127/80 (BP Location: Right Arm)   Pulse 87   Temp 98.7 F (37.1 C) (Oral)   Resp 20   Ht 5' 1.5" (1.562 m)   Wt 179 lb (81.2 kg)   SpO2 100%   BMI 33.27 kg/m   Physical Exam  Constitutional: She is oriented to person, place, and time. She appears well-developed and well-nourished. No distress.  HENT:  Head: Normocephalic.  Mouth/Throat: Uvula is midline, oropharynx is clear and moist and mucous membranes are normal.  Eyes: Conjunctivae are normal. Pupils are equal, round, and reactive to light. No scleral icterus.  Neck: Normal range of motion. Neck supple. No thyromegaly present.  Cardiovascular: Normal rate, regular rhythm and normal heart sounds.  Exam reveals no gallop and no friction rub.   No murmur heard. Pulmonary/Chest: Effort normal and breath sounds normal. No respiratory distress. She has no wheezes. She has no rales.  Bronchospasm with cough only  Abdominal: Soft. Bowel sounds are normal. She exhibits no distension. There is no tenderness. There is no rebound.  Musculoskeletal: Normal range of motion.  Lymphadenopathy:    She has no cervical adenopathy.  Neurological: She is alert and oriented to person, place, and time.  Skin: Skin is warm and dry. No rash noted.  Psychiatric: She has a normal mood and affect. Her  behavior is normal.  Nursing note and vitals reviewed.    ED Treatments / Results  DIAGNOSTIC STUDIES: Oxygen Saturation is 100% on RA, nl by my interpretation.    COORDINATION OF CARE: 5:47 PM Discussed treatment plan with pt at bedside which includes CXR, albuterol inhaler, tamiflu, steroids, and pt agreed to plan.   Radiology Dg Chest 2 View  Result Date: 12/20/2016 CLINICAL DATA:  Cough, fever EXAM: CHEST  2 VIEW COMPARISON:  None. FINDINGS: Small hiatal hernia. Heart is normal size. Lungs are clear. Probable calcified mediastinal lymph nodes. No effusions or acute bony abnormality. IMPRESSION: No active cardiopulmonary disease. Electronically Signed   By: Charlett Nose M.D.   On: 12/20/2016 16:12    Procedures Procedures (including critical care time)  Medications Ordered in ED Medications  predniSONE (DELTASONE) tablet 60 mg (not administered)  benzonatate (TESSALON) capsule 100 mg (not administered)  albuterol (PROVENTIL HFA;VENTOLIN HFA) 108 (90 Base) MCG/ACT inhaler  2 puff (not administered)     Initial Impression / Assessment and Plan / ED Course  I have reviewed the triage vital signs and the nursing notes.  Pertinent imaging results that were available during my care of the patient were reviewed by me and considered in my medical decision making (see chart for details).     Non-hypoxemic. Not febrile here. Bronchospasm but otherwise asymmetric chest exam. No increased work of breathing. Given albuterol. No pneumonia on x-ray. Treatment with Tamiflu, Tessalon, prednisone, albuterol.  Final Clinical Impressions(s) / ED Diagnoses   Final diagnoses:  Influenza-like illness    New Prescriptions New Prescriptions   BENZONATATE (TESSALON) 100 MG CAPSULE    Take 1 capsule (100 mg total) by mouth every 8 (eight) hours.   OSELTAMIVIR (TAMIFLU) 75 MG CAPSULE    Take 1 capsule (75 mg total) by mouth every 12 (twelve) hours.   PREDNISONE (DELTASONE) 20 MG TABLET     Take 1 tablet (20 mg total) by mouth 2 (two) times daily with a meal.  I personally performed the services described in this documentation, which was scribed in my presence. The recorded information has been reviewed and is accurate.    Rolland PorterMark Adeana Grilliot, MD 12/20/16 [phone]

## 2016-12-20 NOTE — ED Triage Notes (Signed)
Pt reports that she doesn't feel like talking.  States that she has the flu and all of the flu symptoms.  A/O, no acute distress noted.  Does report cough and body aches.

## 2018-03-16 ENCOUNTER — Encounter (HOSPITAL_BASED_OUTPATIENT_CLINIC_OR_DEPARTMENT_OTHER): Payer: Self-pay | Admitting: *Deleted

## 2018-03-16 ENCOUNTER — Other Ambulatory Visit: Payer: Self-pay

## 2018-03-16 ENCOUNTER — Emergency Department (HOSPITAL_BASED_OUTPATIENT_CLINIC_OR_DEPARTMENT_OTHER)
Admission: EM | Admit: 2018-03-16 | Discharge: 2018-03-16 | Disposition: A | Discharge status: Home / Self Care | Payer: Non-veteran care | Attending: Emergency Medicine | Admitting: Emergency Medicine

## 2018-03-16 DIAGNOSIS — R1013 Epigastric pain: Secondary | ICD-10-CM

## 2018-03-16 DIAGNOSIS — R112 Nausea with vomiting, unspecified: Secondary | ICD-10-CM | POA: Insufficient documentation

## 2018-03-16 DIAGNOSIS — Z8719 Personal history of other diseases of the digestive system: Secondary | ICD-10-CM | POA: Insufficient documentation

## 2018-03-16 DIAGNOSIS — Z79899 Other long term (current) drug therapy: Secondary | ICD-10-CM | POA: Insufficient documentation

## 2018-03-16 DIAGNOSIS — R197 Diarrhea, unspecified: Secondary | ICD-10-CM | POA: Insufficient documentation

## 2018-03-16 DIAGNOSIS — E1140 Type 2 diabetes mellitus with diabetic neuropathy, unspecified: Secondary | ICD-10-CM | POA: Insufficient documentation

## 2018-03-16 LAB — COMPREHENSIVE METABOLIC PANEL
ALT: 19 U/L (ref 14–54)
AST: 26 U/L (ref 15–41)
Albumin: 4.8 g/dL (ref 3.5–5.0)
Alkaline Phosphatase: 102 U/L (ref 38–126)
Anion gap: 13 (ref 5–15)
BILIRUBIN TOTAL: 0.7 mg/dL (ref 0.3–1.2)
BUN: 13 mg/dL (ref 6–20)
CALCIUM: 9.9 mg/dL (ref 8.9–10.3)
CO2: 23 mmol/L (ref 22–32)
Chloride: 100 mmol/L — ABNORMAL LOW (ref 101–111)
Creatinine, Ser: 0.74 mg/dL (ref 0.44–1.00)
GFR calc Af Amer: >60 mL/min (ref >60)
Glucose, Bld: 174 mg/dL — ABNORMAL HIGH (ref 65–99)
Potassium: 3.2 mmol/L — ABNORMAL LOW (ref 3.5–5.1)
Sodium: 136 mmol/L (ref 135–145)
TOTAL PROTEIN: 9.1 g/dL — AB (ref 6.5–8.1)

## 2018-03-16 LAB — CBC WITH DIFFERENTIAL/PLATELET
Basophils Absolute: 0 10*3/uL (ref 0.0–0.1)
Basophils Relative: 0 %
Eosinophils Absolute: 0 10*3/uL (ref 0.0–0.7)
Eosinophils Relative: 0 %
HEMATOCRIT: 43.2 % (ref 36.0–46.0)
Hemoglobin: 15.5 g/dL — ABNORMAL HIGH (ref 12.0–15.0)
LYMPHS PCT: 18 %
Lymphs Abs: 1.7 10*3/uL (ref 0.7–4.0)
MCH: 32.4 pg (ref 26.0–34.0)
MCHC: 35.9 g/dL (ref 30.0–36.0)
MCV: 90.4 fL (ref 78.0–100.0)
Monocytes Absolute: 0.5 10*3/uL (ref 0.1–1.0)
Monocytes Relative: 5 %
NEUTROS ABS: 7 10*3/uL (ref 1.7–7.7)
NEUTROS PCT: 77 %
Platelets: 249 10*3/uL (ref 150–400)
RBC: 4.78 MIL/uL (ref 3.87–5.11)
RDW: 12.9 % (ref 11.5–15.5)
WBC: 9.1 10*3/uL (ref 4.0–10.5)

## 2018-03-16 LAB — LIPASE, BLOOD: LIPASE: 43 U/L (ref 11–51)

## 2018-03-16 MED ORDER — ONDANSETRON HCL 4 MG/2ML IJ SOLN
4.0000 mg | Freq: Once | INTRAMUSCULAR | Status: AC
  Administered 2018-03-16: 4 mg via INTRAVENOUS
  Filled 2018-03-16: qty 2

## 2018-03-16 MED ORDER — ONDANSETRON 4 MG PO TBDP: 4.0000 mg | ORAL_TABLET | Freq: Three times a day (TID) | ORAL | 0 refills | Status: AC | PRN

## 2018-03-16 MED ORDER — GI COCKTAIL ~~LOC~~
30.0000 mL | Freq: Once | ORAL | Status: AC
  Administered 2018-03-16: 30 mL via ORAL
  Filled 2018-03-16: qty 30

## 2018-03-16 MED ORDER — SODIUM CHLORIDE 0.9 % IV BOLUS
1000.0000 mL | Freq: Once | INTRAVENOUS | Status: AC
  Administered 2018-03-16: 1000 mL via INTRAVENOUS

## 2018-03-16 NOTE — ED Triage Notes (Signed)
Abdominal pain since yesterday. Chills, vomiting and diarrhea.

## 2018-03-16 NOTE — ED Provider Notes (Signed)
[facility] EMERGENCY DEPARTMENT Provider Note  CSN: [id number] Arrival date & time: 03/16/18 1620  Chief Complaint(s) Abdominal Pain  HPI Selena Long is a 62 y.o. female h/o daily EtOH use and prior pancreatitis  The history is provided by the patient.  Abdominal Pain   This is a recurrent (may be similar to prior pancreatitis) problem. The current episode started 12 to 24 hours ago. The problem occurs constantly. Progression since onset: flucutating. The pain is associated with alcohol use. The pain is located in the epigastric region. The pain is moderate. Associated symptoms include diarrhea (mild, loose stools), nausea and vomiting (NBNB). Pertinent negatives include fever and constipation. Nothing aggravates the symptoms. Nothing relieves the symptoms.    Past Medical History Past Medical History:  Diagnosis Date  . Acid reflux   . Diabetes mellitus without complication (HCC)   . High cholesterol   . Hyperthyroidism   . Neuropathy   . Pancreatitis    There are no active problems to display for this patient.  Home Medication(s) Prior to Admission medications   Medication Sig Start Date End Date Taking? Authorizing Provider  benzonatate (TESSALON) 100 MG capsule Take 1 capsule (100 mg total) by mouth every 8 (eight) hours. 12/20/16   Rolland Porter, MD  Calcium Carbonate-Vitamin D (CALCIUM 600+D HIGH POTENCY) 600-400 MG-UNIT tablet Take 1 tablet by mouth daily.    [provider]  cholecalciferol (VITAMIN D) 1000 units tablet Take 1,000 Units by mouth daily.    [provider]  cyclobenzaprine (FLEXERIL) 10 MG tablet Take 10 mg by mouth 3 (three) times daily as needed for muscle spasms.    [provider]  docusate sodium (COLACE) 50 MG capsule Take 50 mg by mouth 2 (two) times daily.    [provider]  famotidine (PEPCID) 20 MG tablet Take 1 tablet (20 mg total) by mouth 2 (two) times daily. 06/06/16   Jacalyn Lefevre, MD  gabapentin  (NEURONTIN) 800 MG tablet Take 800 mg by mouth 3 (three) times daily.    [provider]  HYDROcodone-acetaminophen (NORCO/VICODIN) 5-325 MG tablet Take 1-2 tablets by mouth every 6 (six) hours as needed. 05/20/16   Ward, Layla Maw, DO  loratadine (CLARITIN) 10 MG tablet Take 10 mg by mouth daily.    [provider]  methimazole (TAPAZOLE) 5 MG tablet Take 5 mg by mouth daily.    [provider]  nortriptyline (PAMELOR) 10 MG capsule Take 10 mg by mouth at bedtime.    [provider]  ondansetron (ZOFRAN ODT) 4 MG disintegrating tablet Take 1 tablet (4 mg total) by mouth every 8 (eight) hours as needed for up to 3 days for nausea or vomiting. 03/16/18 03/19/18  Nira Conn, MD  oseltamivir (TAMIFLU) 75 MG capsule Take 1 capsule (75 mg total) by mouth every 12 (twelve) hours. 12/20/16   Rolland Porter, MD  predniSONE (DELTASONE) 20 MG tablet Take 1 tablet (20 mg total) by mouth 2 (two) times daily with a meal. 12/20/16   Rolland Porter, MD  promethazine (PHENERGAN) 25 MG tablet Take 1 tablet (25 mg total) by mouth every 6 (six) hours as needed for nausea or vomiting. 06/06/16   Jacalyn Lefevre, MD  simvastatin (ZOCOR) 20 MG tablet Take 20 mg by mouth at bedtime.    [provider]  Past Surgical History Past Surgical History:  Procedure Laterality Date  . ABDOMINAL HYSTERECTOMY    . ABDOMINAL SURGERY    . TONSILLECTOMY     Family History No family history on file.  Social History Social History   Tobacco Use  . Smoking status: Never Smoker  . Smokeless tobacco: Never Used  Substance Use Topics  . Alcohol use: Yes    Comment: occ  . Drug use: No   Allergies Codeine and Penicillins  Review of Systems Review of Systems  Constitutional: Negative for fever.  Gastrointestinal: Positive for abdominal pain, diarrhea  (mild, loose stools), nausea and vomiting (NBNB). Negative for constipation.   All other systems are reviewed and are negative for acute change except as noted in the HPI  Physical Exam Vital Signs  I have reviewed the triage vital signs BP (!) 162/79 (BP Location: Right Arm)   Pulse 80   Temp 99 F (37.2 C) (Oral)   Resp 18   Ht 5' 1.5" (1.562 m)   Wt 81.2 kg (179 lb)   SpO2 99%   BMI 33.27 kg/m   Physical Exam  Constitutional: She is oriented to person, place, and time. She appears well-developed and well-nourished. No distress.  HENT:  Head: Normocephalic and atraumatic.  Right Ear: External ear normal.  Left Ear: External ear normal.  Nose: Nose normal.  Eyes: Conjunctivae and EOM are normal. No scleral icterus.  Neck: Normal range of motion and phonation normal.  Cardiovascular: Normal rate and regular rhythm.  Pulmonary/Chest: Effort normal. No stridor. No respiratory distress.  Abdominal: She exhibits no distension. There is tenderness in the epigastric area. There is no rigidity, no rebound, no guarding and no CVA tenderness.  Musculoskeletal: Normal range of motion. She exhibits no edema.  Neurological: She is alert and oriented to person, place, and time.  Skin: She is not diaphoretic.  Psychiatric: She has a normal mood and affect. Her behavior is normal.  Vitals reviewed.   ED Results and Treatments Labs (all labs ordered are listed, but only abnormal results are displayed) Labs Reviewed  CBC WITH DIFFERENTIAL/PLATELET - Abnormal; Notable for the following components:      Result Value   Hemoglobin 15.5 (*)    All other components within normal limits  COMPREHENSIVE METABOLIC PANEL - Abnormal; Notable for the following components:   Potassium 3.2 (*)    Chloride 100 (*)    Glucose, Bld 174 (*)    Total Protein 9.1 (*)    All other components within normal limits  LIPASE, BLOOD                                                                                                                          EKG  EKG Interpretation  Date/Time:    Ventricular Rate:    PR Interval:    QRS Duration:   QT Interval:    QTC Calculation:   R Axis:     Text Interpretation:  Radiology No results found. Pertinent labs & imaging results that were available during my care of the patient were reviewed by me and considered in my medical decision making (see chart for details).  Medications Ordered in ED Medications  sodium chloride 0.9 % bolus 1,000 mL (0 mLs Intravenous Stopped 03/16/18 2119)  ondansetron (ZOFRAN) injection 4 mg (4 mg Intravenous Given 03/16/18 2013)  gi cocktail (Maalox,Lidocaine,Donnatal) (30 mLs Oral Given 03/16/18 2013)                                                                                                                                    Procedures Procedures  (including critical care time)  Medical Decision Making / ED Course I have reviewed the nursing notes for this encounter and the patient's prior records (if available in EHR or on provided paperwork).    Abdomen with mild epigastric abdominal discomfort.  Suspicion for possible gastritis versus pancreatitis versus gastroenteritis versus food poisoning.  Abdomen not peritoneal take.  Will obtain screening labs labs without leukocytosis.  No significant electrolyte derangements or renal insufficiency.  No evidence of biliary obstruction or pancreatitis.  Patient provided with antiemetics, IV fluids and GI cocktail providing significant improvement.  Low suspicion for serious intra-abdominal inflammatory/infectious process requiring imaging at this time.  The patient is safe for discharge with strict return precautions.  The patient appears reasonably screened and/or stabilized for discharge and I doubt any other medical condition or other [facility] requiring further screening, evaluation, or treatment in the ED at this time prior to discharge.   Final Clinical  Impression(s) / ED Diagnoses Final diagnoses:  Nausea vomiting and diarrhea  Epigastric pain    Disposition: Discharge  Condition: Good  I have discussed the results, Dx and Tx plan with the patient who expressed understanding and agree(s) with the plan. Discharge instructions discussed at great length. The patient was given strict return precautions who verbalized understanding of the instructions. No further questions at time of discharge.    ED Discharge Orders        Ordered    ondansetron (ZOFRAN ODT) 4 MG disintegrating tablet  Every 8 hours PRN     03/16/18 2207     This chart was dictated using voice recognition software.  Despite best efforts to proofread,  errors can occur which can change the documentation meaning.   Nira Conn, MD 03/16/18 2208

## 2018-03-16 NOTE — ED Notes (Signed)
Pt states she is going to wait in the car. Explained if she does not answer when she is called we will move to the next pt. And after 3 calls we remove her name.

## 2018-03-16 NOTE — Discharge Instructions (Signed)

## 2018-03-16 NOTE — ED Notes (Signed)
Pt daughter cursing at this rn, "we have been here over 3 hours, this shit is crazy!" informed pt that she and her mother have been here for 2 hours and 20 minutes, and that we are working hard to get them in the back to be seen by the doctor. Pt mother given blanket per request. Pt daughter states "we are going to wait in my car." informed that if we call them and there is no response she may lose her place in line. Daughter states "we better not lose our place in line, this is bullshit!" pt taken out ER doors and to car by her daughter.

## 2018-03-16 NOTE — ED Notes (Signed)
Pt called for repeat vital signs, no answer.

## 2018-03-16 NOTE — ED Notes (Signed)
Went out to parking lot to retrieve wheelchair.  Family states she is using it for her mother.  Informed her that she is next and should come inside.  Daughter very hateful, states that someone told her that she would get a text when it is her turn.  Tried to explain the process but she did not want to listen and was very rude.

## 2018-03-18 MED FILL — ONDANSETRON ODT 4 MG TABLET: 4 | 5 days supply | Qty: 15 | Fill #0

## 2018-04-24 IMAGING — CR DG CHEST 2V
2 series · 2 of 2 positions shown · non-contrast
Comparison: None.

CLINICAL DATA: Cough, fever

EXAM:
CHEST  2 VIEW

[w chest pa]
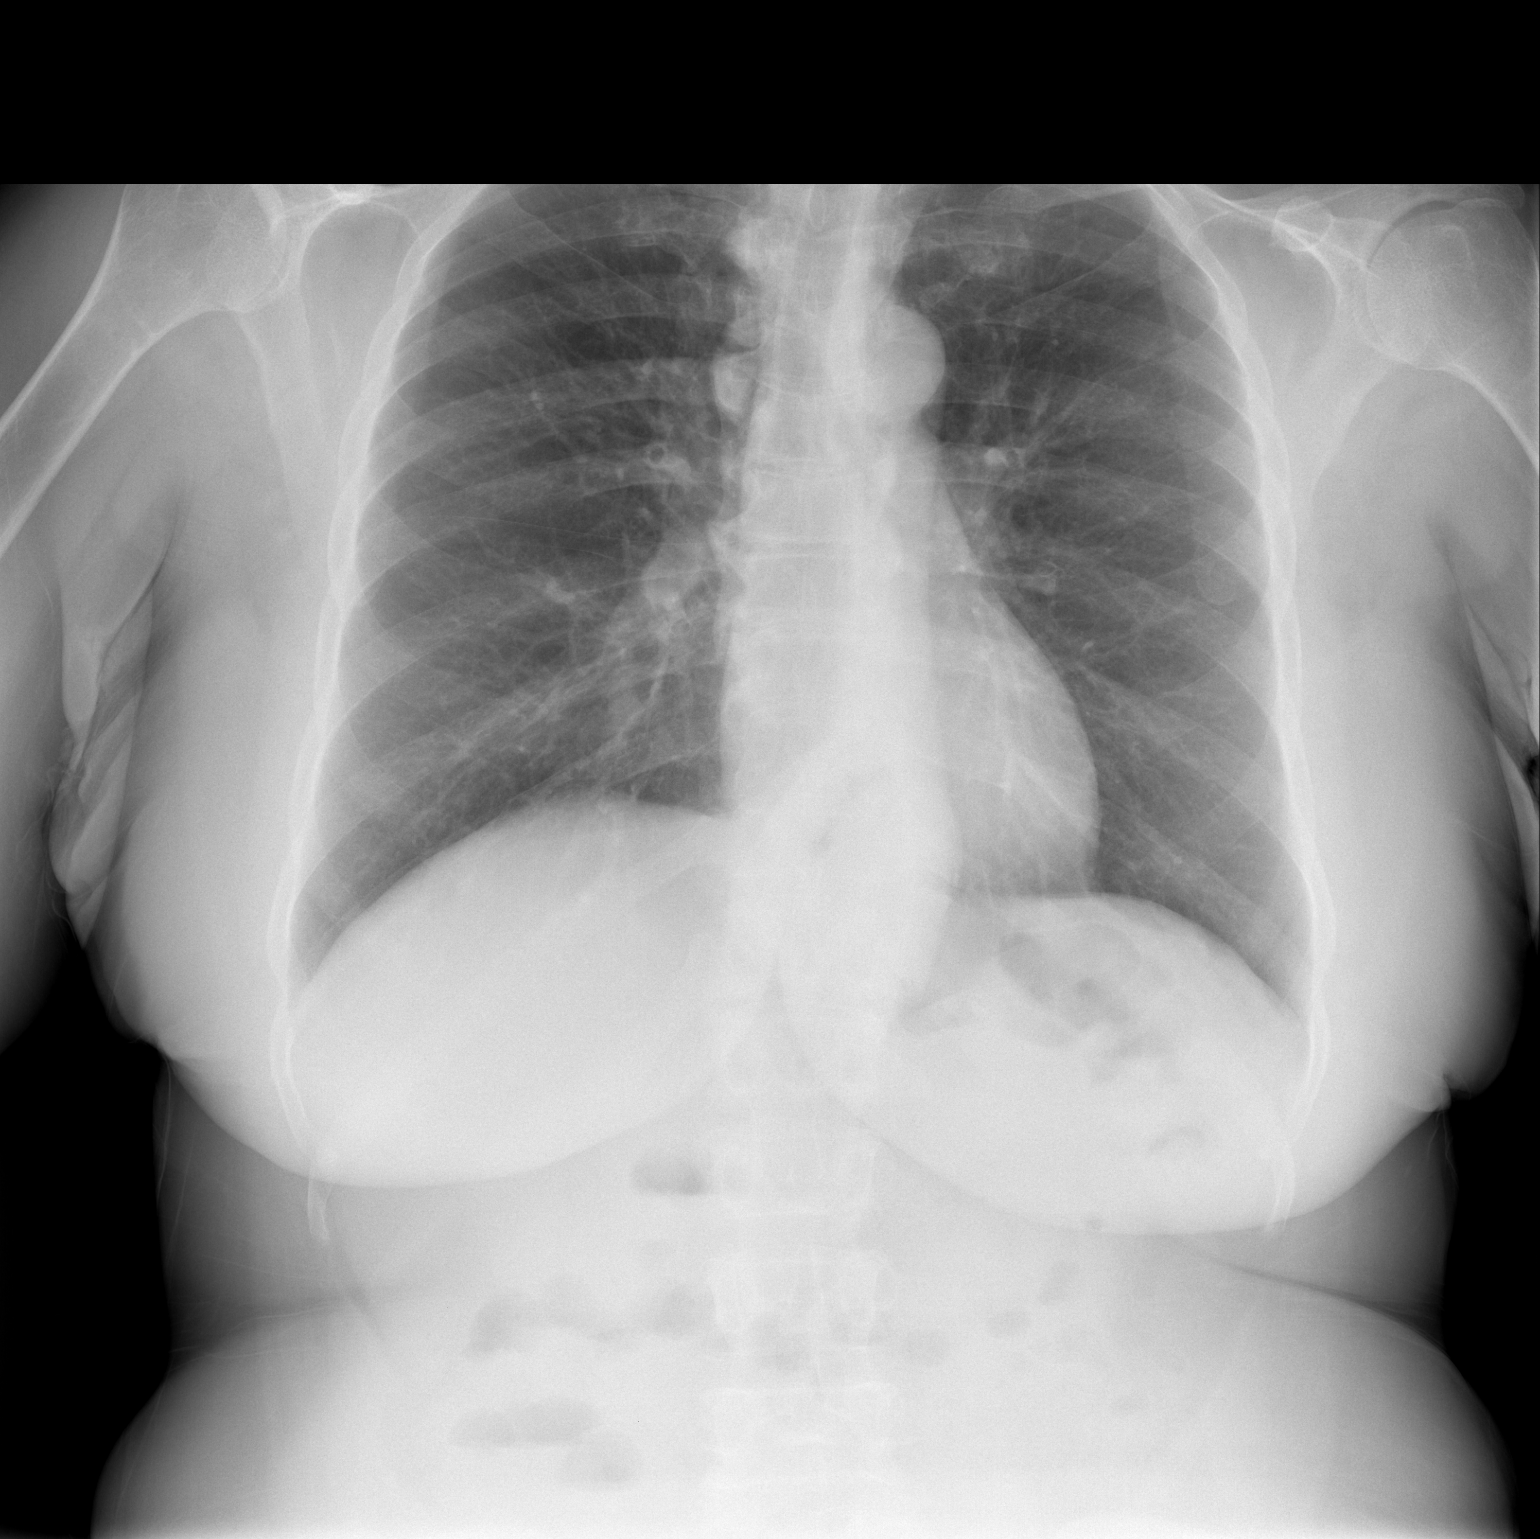

[w chest lat]
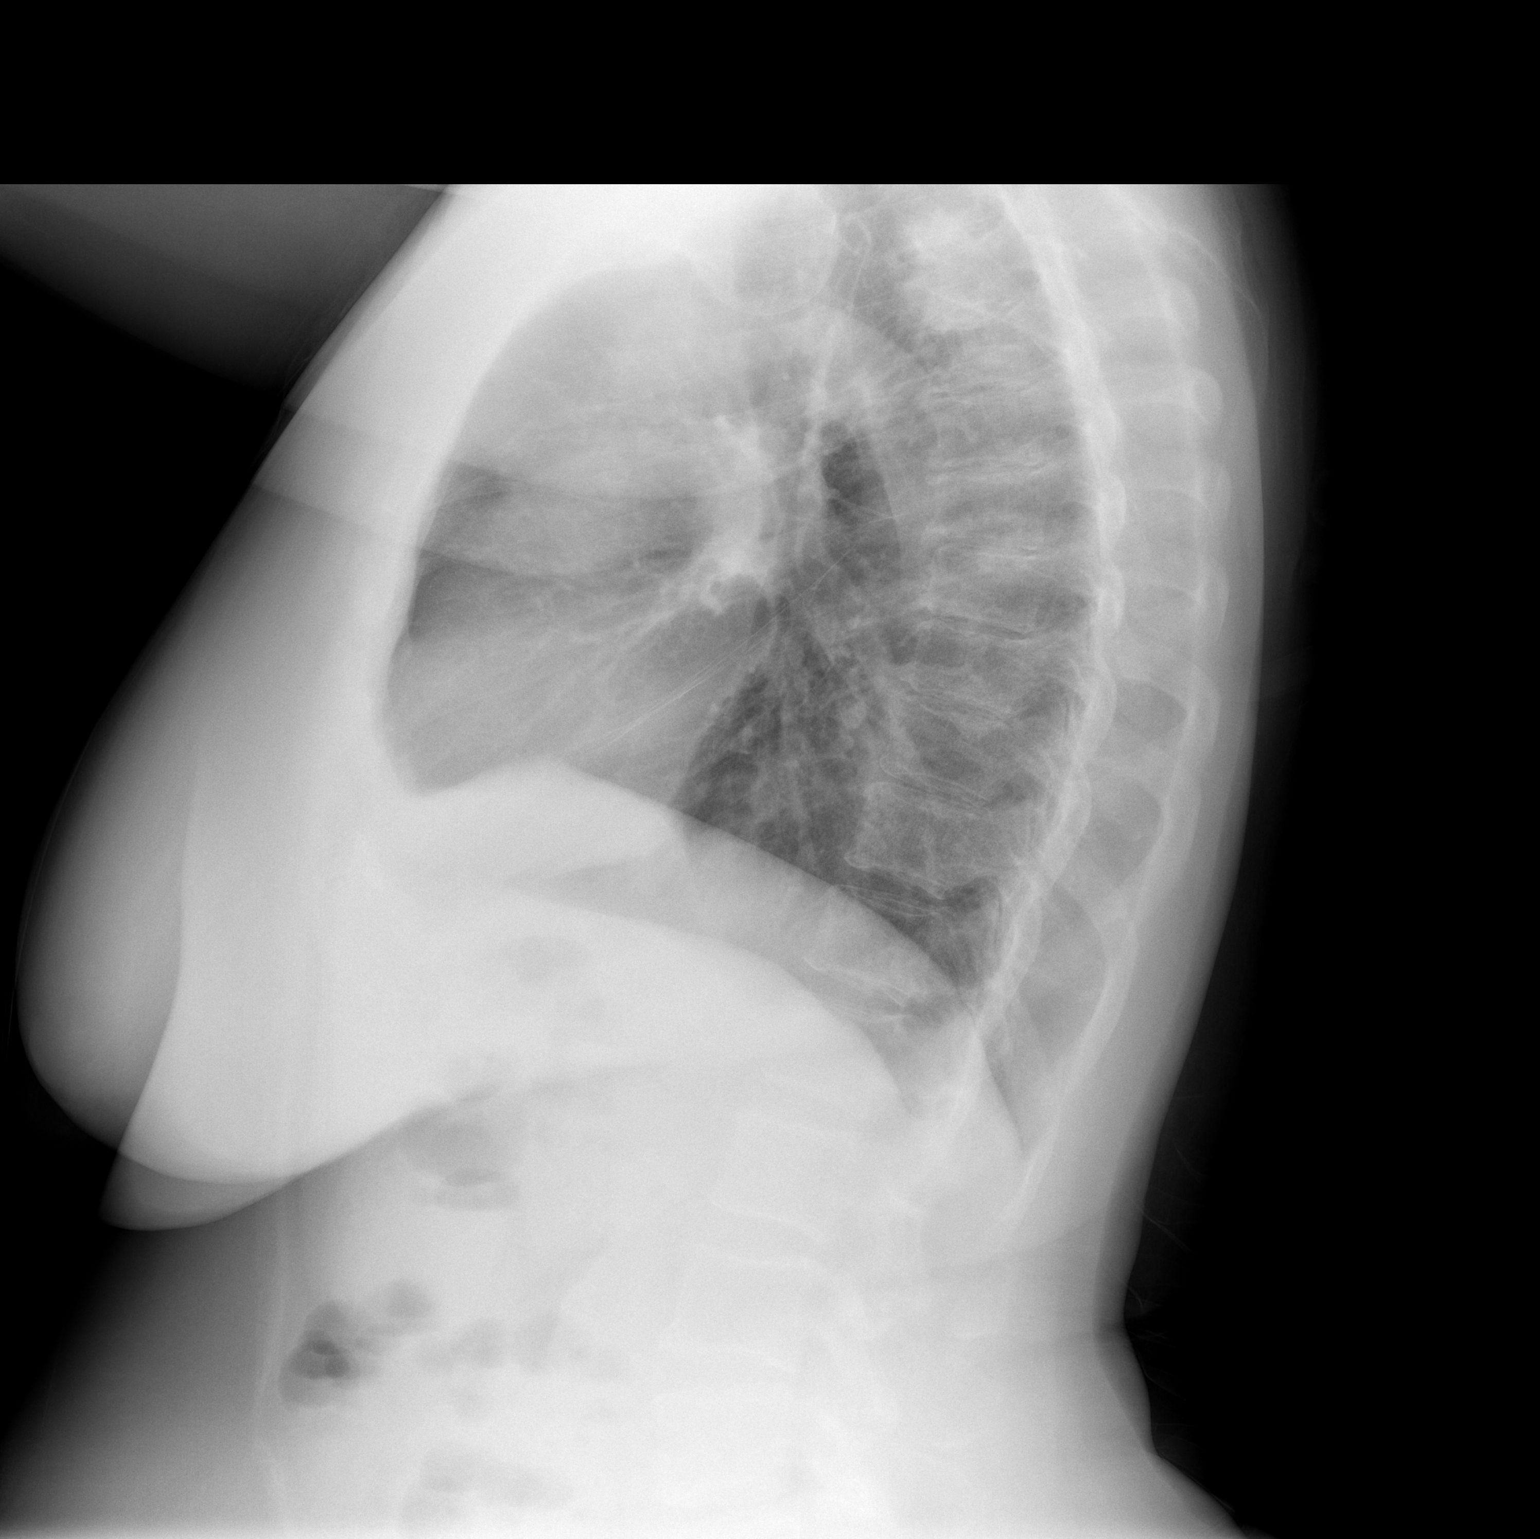

[2 of 2 positions shown; findings below may reference images not displayed]

FINDINGS: Small hiatal hernia. Heart is normal size. Lungs are clear. Probable
calcified mediastinal lymph nodes. No effusions or acute bony
abnormality.
IMPRESSION: No active cardiopulmonary disease.

## 2019-09-29 ENCOUNTER — Ambulatory Visit: Payer: No Typology Code available for payment source | Attending: Internal Medicine | Admitting: Physical Therapy

## 2019-09-29 ENCOUNTER — Encounter: Payer: Self-pay | Admitting: Physical Therapy

## 2019-09-29 ENCOUNTER — Other Ambulatory Visit: Payer: Self-pay

## 2019-09-29 DIAGNOSIS — N3941 Urge incontinence: Secondary | ICD-10-CM | POA: Diagnosis present

## 2019-09-29 DIAGNOSIS — R278 Other lack of coordination: Secondary | ICD-10-CM | POA: Diagnosis present

## 2019-09-29 DIAGNOSIS — M6281 Muscle weakness (generalized): Secondary | ICD-10-CM | POA: Diagnosis not present

## 2019-09-29 NOTE — Therapy (Signed)
[facility] [address] [place], [place], [address] Phone: [phone]   Fax:  [phone]  Physical Therapy Evaluation  Patient Details  Name: Selena Long MRN: [id number] Date of Birth: 13-Feb-1956 Referring Provider (PT): Dr. Clint Guy   Encounter Date: 09/29/2019  PT End of Session - 09/29/19 1053    Visit Number  1    Date for PT Re-Evaluation  12/22/19    Authorization Type  [facility]    Authorization Time Period  09/15/19-04/15/2020    Authorization - Visit Number  1    Authorization - Number of Visits  15    PT Start Time  1015    PT Stop Time  1053    PT Time Calculation (min)  38 min    Activity Tolerance  Patient tolerated treatment well    Behavior During Therapy  [facility] for tasks assessed/performed       Past Medical History:  Diagnosis Date  . Acid reflux   . Diabetes mellitus without complication (HCC)   . High cholesterol   . Hyperthyroidism   . Neuropathy   . Pancreatitis     Past Surgical History:  Procedure Laterality Date  . ABDOMINAL HYSTERECTOMY    . ABDOMINAL SURGERY    . TONSILLECTOMY      There were no vitals filed for this visit.   Subjective Assessment - 09/29/19 1023    Subjective  Patient reports it has been awhile with leakage.Patient wears 1-2 pads per day. Patient wears 1 pad duriing the night.    Patient Stated Goals  reduce urinary leakage    Currently in Pain?  No/denies         [facility] PT Assessment - 09/29/19 0001      Assessment   Medical Diagnosis  [id number] Urge incontinence    Referring Provider (PT)  Dr. Clint Guy    Onset Date/Surgical Date  --   chronic   Prior Therapy  none      Precautions   Precautions  None      Restrictions   Weight Bearing Restrictions  No      Balance Screen   Has the patient fallen in the past 6 months  Yes   loss of balance   How many times?  few times    Has the patient had a decrease in activity level because of a  fear of falling?   No    Is the patient reluctant to leave their home because of a fear of falling?   No      Home Public house manager residence      Prior Function   Level of Independence  Independent    Leisure  not going to the [employer] due to covid, walking 1 time per day to walk her dog 15 minutes      Cognition   Overall Cognitive Status  Within Functional Limits for tasks assessed      Posture/Postural Control   Posture/Postural Control  No significant limitations      ROM / Strength   AROM / PROM / Strength  PROM;AROM;Strength      AROM   Lumbar Extension  decreased by 50%      Strength   Right Hip Extension  4/5    Right Hip ABduction  3+/5    Right Hip ADduction  3+/5    Left Hip Extension  4/5    Left Hip ABduction  3+/5  Left Hip ADduction  4/5                Objective measurements completed on examination: See above findings.    Pelvic Floor Special Questions - 09/29/19 0001    Prior Pregnancies  Yes    Number of Pregnancies  1    Number of Vaginal Deliveries  1    Urinary Leakage  Yes    Pad use  2 pads during the day and 1 at night    Activities that cause leaking  With strong urge    Urinary frequency  every couple of hours    Pelvic Floor Internal Exam  Patient confirms identification and approves PT to assess pelvic floor and treatment    Exam Type  Vaginal    Strength  weak squeeze, no lift    Strength # of seconds  5       [facility] Adult PT Treatment/Exercise - 09/29/19 0001      Self-Care   Self-Care  Other Self-Care Comments    Other Self-Care Comments   useing the coconut oil for vaginal dryness             PT Education - 09/29/19 1049    Education Details  pelvic floor exercise in hookly; using coconut oil for the vaginal dryness    Person(s) Educated  Patient    Methods  Explanation;Demonstration;Verbal cues;Handout    Comprehension  Returned demonstration;Verbalized understanding       PT Short  Term Goals - 09/29/19 1107      PT SHORT TERM GOAL #1   Title  independent with initial HEP    Time  4    Period  Weeks    Status  New    Target Date  10/27/19      PT SHORT TERM GOAL #2   Title  unsderstand what bladder irritants are and how they affect the bladder    Time  4    Period  Weeks    Status  New    Target Date  10/27/19      PT SHORT TERM GOAL #3   Title  understand how to manage vaginal dryness with coconut oil    Time  4    Period  Weeks    Status  New    Target Date  10/27/19        PT Long Term Goals - 09/29/19 1111      PT LONG TERM GOAL #1   Title  independent with advanced HEP    Time  12    Period  Weeks    Status  New    Target Date  12/22/19      PT LONG TERM GOAL #2   Title  understand how to perform the urge to void so she is able to make it to the commode in time    Time  12    Period  Weeks    Status  New    Target Date  12/22/19      PT LONG TERM GOAL #3   Title  pelvic floor strength >/= 3/5 holding for 10 seconds so her urinary leakage is >/= 75% better    Time  12    Period  Weeks    Status  New    Target Date  12/22/19      PT LONG TERM GOAL #4   Title  wears 1 light day pad during the day for just in  case due to reduction of urinary leakage    Time  12    Period  Weeks    Status  New    Target Date  12/22/19             Plan - 09/29/19 1054    Clinical Impression Statement  Patient is a 63 year old female with urge incontinence for several years. Patient reports she wears 1-2 pads during the day and 1 during the night. Patient will leak urine when she has the urge to urinate and not able to get to the bathroom in time. 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 Patient ambulates with a SPC. Patient has fallen several times due to the medical issues in her left foot.  Pelvic floor strength is 2/5 holding for 5 seconds. Patient has tightness  in the introitus. Patient is able to do quick flicks. Patient has weakness in her hips. Patient has limited lumbar ROM. Patient will benefit from skilled therapy to improve leakage and improve strength.    Personal Factors and Comorbidities  Age;Fitness;Comorbidity 1    Comorbidities  neuropathy of left foot    Examination-Activity Limitations  Locomotion Level;Continence;Toileting    Stability/Clinical Decision Making  Evolving/Moderate complexity    Clinical Decision Making  Low    Rehab Potential  Good    PT Frequency  1x / week    PT Duration  12 weeks    PT Treatment/Interventions  Biofeedback;Therapeutic activities;Therapeutic exercise;Patient/family education;Neuromuscular re-education;Manual techniques    PT Next Visit Plan  pelvic floor exercise with holding for 8 seconds, ball squeeze in sitting, hip ER in sitting, bladder irritants    Consulted and Agree with Plan of Care  Patient       Patient will benefit from skilled therapeutic intervention in order to improve the following deficits and impairments:  Decreased activity tolerance, Decreased strength, Decreased coordination  Visit Diagnosis: Muscle weakness (generalized) - Plan: PT plan of care cert/re-cert  Other lack of coordination - Plan: PT plan of care cert/re-cert  Urge incontinence - Plan: PT plan of care cert/re-cert     Problem List There are no active problems to display for this patient.   Earlie Counts, PT 09/29/19 11:28 AM   [facility] [address] [place], [place], [address] Phone: [phone]   Fax:  [phone]  Name: Selena Long MRN: [id number] Date of Birth: 1956-09-24

## 2019-09-29 NOTE — Patient Instructions (Addendum)
Quick Contraction: Gravity Eliminated (Hook-Lying)    Lie with hips and knees bent. Quickly squeeze then fully relax pelvic floor. Perform _1__ sets of _5__. Rest for _1__ seconds between sets. Do __4_ times a day.   Copyright  VHI. All rights reserved.  Slow Contraction: Gravity Eliminated (Hook-Lying)    Lie with hips and knees bent. Slowly squeeze pelvic floor for _5__ seconds. Rest for _5__ seconds. Repeat __5_ times. Do _4__ times a day.   Copyright  VHI. All rights reserved.  [place] [address] [place], [place] [address] Phone # [phone] Fax [phone]

## 2019-10-12 ENCOUNTER — Other Ambulatory Visit: Payer: Self-pay

## 2019-10-12 ENCOUNTER — Ambulatory Visit: Payer: No Typology Code available for payment source | Attending: Internal Medicine | Admitting: Physical Therapy

## 2019-10-12 ENCOUNTER — Encounter: Payer: Self-pay | Admitting: Physical Therapy

## 2019-10-12 DIAGNOSIS — R278 Other lack of coordination: Secondary | ICD-10-CM | POA: Insufficient documentation

## 2019-10-12 DIAGNOSIS — N3941 Urge incontinence: Secondary | ICD-10-CM | POA: Insufficient documentation

## 2019-10-12 DIAGNOSIS — M6281 Muscle weakness (generalized): Secondary | ICD-10-CM | POA: Insufficient documentation

## 2019-10-12 NOTE — Therapy (Signed)
[facility] [address], [place], [place], [address] Phone: [phone]   Fax:  [phone]  Physical Therapy Treatment  Patient Details  Name: Selena Long MRN: [id number] Date of Birth: 11-18-1955 Referring Provider (PT): Dr. Nicky Pugh   Encounter Date: 10/12/2019  PT End of Session - 10/12/19 1106    Visit Number  2    Date for PT Re-Evaluation  12/22/19    Authorization Type  [facility]    Authorization Time Period  09/15/19-04/15/2020    Authorization - Visit Number  2    Authorization - Number of Visits  15    PT Start Time  1100    PT Stop Time  1140    PT Time Calculation (min)  40 min    Activity Tolerance  Patient tolerated treatment well    Behavior During Therapy  [facility] for tasks assessed/performed       Past Medical History:  Diagnosis Date  . Acid reflux   . Diabetes mellitus without complication ([place])   . High cholesterol   . Hyperthyroidism   . Neuropathy   . Pancreatitis     Past Surgical History:  Procedure Laterality Date  . ABDOMINAL HYSTERECTOMY    . ABDOMINAL SURGERY    . TONSILLECTOMY      There were no vitals filed for this visit.  Subjective Assessment - 10/12/19 1102    Subjective  I felt good after last visit. When I get up in the morning I am peeing as I am going to the bathroom.    Patient Stated Goals  reduce urinary leakage    Currently in Pain?  No/denies                       [facility] Adult PT Treatment/Exercise - 10/12/19 0001      Self-Care   Self-Care  Other Self-Care Comments    Other Self-Care Comments   instructed patient on bladder irritants and how they affect the bladder and not to drink 2 hours prior to going to bed to reduce leakage.       Neuro Re-ed    Neuro Re-ed Details   sitting on red physioball moving pelvis side to side, pelvic tilt, pellvic circles      Lumbar Exercises: Aerobic   Nustep  level 1 seat #7, arm #8, for 7 miutes while  asessing patient      Lumbar Exercises: Seated   Other Seated Lumbar Exercises  seated with ball squeeze holding 8 seconds 10 times with pelvic floor contraction; pelvic tilt in sitting    Other Seated Lumbar Exercises  seated hip ER with yellow band 10 times with pelvic floor contraction      Lumbar Exercises: Supine   Clam  10 reps;1 second   each leg with pelvic floor contraction   Bridge with Ball Squeeze  10 reps;1 second   with pelvic floor contraction   Bridge with [employer] Limitations  breath out with lift, increased back pain so not to do     Basic Lumbar Stabilization  20 reps;1 second   abdominal bracing with alt shoulder flexion     Knee/Hip Exercises: Supine   Other Supine Knee/Hip Exercises  one legged clam in supine one leg at a time 10x each with pelvic floor contraction             PT Education - 10/12/19 1140    Education Details  pelvic floor  exercises and sitting on ball moving pelvis    Person(s) Educated  Patient    Methods  Explanation;Demonstration;Handout    Comprehension  Verbalized understanding;Returned demonstration       PT Short Term Goals - 09/29/19 1107      PT SHORT TERM GOAL #1   Title  independent with initial HEP    Time  4    Period  Weeks    Status  New    Target Date  10/27/19      PT SHORT TERM GOAL #2   Title  unsderstand what bladder irritants are and how they affect the bladder    Time  4    Period  Weeks    Status  New    Target Date  10/27/19      PT SHORT TERM GOAL #3   Title  understand how to manage vaginal dryness with coconut oil    Time  4    Period  Weeks    Status  New    Target Date  10/27/19        PT Long Term Goals - 09/29/19 1111      PT LONG TERM GOAL #1   Title  independent with advanced HEP    Time  12    Period  Weeks    Status  New    Target Date  12/22/19      PT LONG TERM GOAL #2   Title  understand how to perform the urge to void so she is able to make it to the commode in time     Time  12    Period  Weeks    Status  New    Target Date  12/22/19      PT LONG TERM GOAL #3   Title  pelvic floor strength >/= 3/5 holding for 10 seconds so her urinary leakage is >/= 75% better    Time  12    Period  Weeks    Status  New    Target Date  12/22/19      PT LONG TERM GOAL #4   Title  wears 1 light day pad during the day for just in case due to reduction of urinary leakage    Time  12    Period  Weeks    Status  New    Target Date  12/22/19            Plan - 10/12/19 1141    Clinical Impression Statement  Patient reports she will leak urine as she sits up in the bed and walking to the bathroom. Patient had increased back pain with bridges therefore will not be done. Patient had reduced back pain with sitting on ball performing exercises. Patient was able to feel her pelvic floor contract. Patient understands bladder irritants. Patient has not met goals due to just starting therapy. Patient will benefit from skilled therapy to improve leakage and improve strength.    Personal Factors and Comorbidities  Age;Fitness;Comorbidity 1    Comorbidities  neuropathy of left foot    Examination-Activity Limitations  Locomotion Level;Continence;Toileting    Stability/Clinical Decision Making  Evolving/Moderate complexity    Rehab Potential  Good    PT Frequency  1x / week    PT Duration  12 weeks    PT Treatment/Interventions  Biofeedback;Therapeutic activities;Therapeutic exercise;Patient/family education;Neuromuscular re-education;Manual techniques    PT Next Visit Plan  sitting on ball contracting the pelvic floor, review exercises, supine  resisted hip flexion, sit and contract 10 seconds followed quick flicks    Consulted and Agree with Plan of Care  Patient       Patient will benefit from skilled therapeutic intervention in order to improve the following deficits and impairments:  Decreased activity tolerance, Decreased strength, Decreased coordination  Visit  Diagnosis: Muscle weakness (generalized)  Other lack of coordination  Urge incontinence     Problem List There are no active problems to display for this patient.   Earlie Counts, PT 10/12/19 11:45 AM   [facility] [address] [place], [place], [address] Phone: [phone]   Fax:  [phone]  Name: Jaclynne Baldo MRN: [id number] Date of Birth: 02/25/1956

## 2019-10-12 NOTE — Patient Instructions (Addendum)
Do not drink liquid 2 hours prior to going to bed.   Certain foods and liquids will decrease the pH making the urine more acidic.  Urinary urgency increases when the urine has a low pH.  Most common irritants: alcohol, carbonated beverages and caffinated beverages.  Foods to avoid: apple juice, apples, ascorbic acid, canteloupes, chili, citrus fruits, coffee, cranberries, grapes, guava, peaches, pepper, pineapple, plums, strawberries, tea, tomatoes, and vinegar.  Drinking plenty of water may help to increase the pH and dilute out any of the effects of specific irritants.  Foods that are NOT irritating to the bladder include: Pears, papayas, sun-brewed teas, watermelons, non-citrus herbal teas, apricots, kava and low-acid instant drinks ([employer]um)  Adduction: Hip - Knees Together With Pelvic Floor (Sitting)    Sit with towel roll between knees. Squeeze pelvic floor while pushing knees together. Hold for _8__ seconds. Rest for _5__ seconds. Repeat _10__ times. Do __2_ times a day.  Copyright  VHI. All rights reserved.  External Rotation: Hip - Knees Apart With Pelvic Floor (Sitting)    Sit, band tied just above knees. Squeeze pelvic floor while pulling knees apart. Hold for _3__ seconds. Rest for _3__ seconds. Repeat _10__ times. Do _2__ times a day.  Copyright  VHI. All rights reserved.  Bracing With Knee Fallout (Hook-Lying)    With neutral spine, tighten pelvic floor and abdominals and hold. Alternating legs, drop knee out to side. Keep opposite hip still. Repeat _10__ times. Do _1__ times a day. Each leg  Copyright  VHI. All rights reserved.  Bracing With Arm Lift (Hook-Lying)    With neutral spine, tighten pelvic floor and abdominals and hold. Alternating arms, raise over head and return to side. Repeat _15__ times. Do _1__ times a day.   Copyright  VHI. All rights reserved.  Pelvic Tilt (Sitting)    Sit on ball. Tighten pelvic floor and hold. Roll ball slightly  forward by tilting pelvis backwards. Relax. Repeat __10_ times. Do __1Side Tilt (Sitting)    Sit on ball. Tighten pelvic floor and hold. Roll ball slightly to one side by tilting pelvis the opposite direction. Repeat to other side. Relax. Repeat _10__ times. Do __1_ times a day. Advanced: Continue to hold squeeze through repetitions.  Copyright  VHI. All rights reserved.  _ times a day. Advanced: Continue to hold squeeze through repetitions.  [facility] [address], [place], [place] [address] Phone # [phone] Fax [phone]   Copyright  VHI. All rights reserved.

## 2019-10-19 ENCOUNTER — Ambulatory Visit: Payer: No Typology Code available for payment source | Admitting: Physical Therapy

## 2019-10-19 ENCOUNTER — Other Ambulatory Visit: Payer: Self-pay

## 2019-10-19 ENCOUNTER — Encounter: Payer: Self-pay | Admitting: Physical Therapy

## 2019-10-19 DIAGNOSIS — M6281 Muscle weakness (generalized): Secondary | ICD-10-CM | POA: Diagnosis not present

## 2019-10-19 DIAGNOSIS — R278 Other lack of coordination: Secondary | ICD-10-CM

## 2019-10-19 DIAGNOSIS — N3941 Urge incontinence: Secondary | ICD-10-CM

## 2019-10-19 NOTE — Therapy (Signed)
[facility] [address], [place], [place], [address] Phone: [phone]   Fax:  [phone]  Physical Therapy Treatment  Patient Details  Name: Selena Long MRN: [id number] Date of Birth: 01/30/1956 Referring Provider (PT): Dr. Nicky Pugh   Encounter Date: 10/19/2019  PT End of Session - 10/19/19 1059    Visit Number  3    Date for PT Re-Evaluation  12/22/19    Authorization Type  VA    Authorization Time Period  09/15/19-04/15/2020    Authorization - Visit Number  3    Authorization - Number of Visits  15    PT Start Time  0102    PT Stop Time  1055    PT Time Calculation (min)  40 min    Activity Tolerance  Patient tolerated treatment well    Behavior During Therapy  [facility] for tasks assessed/performed       Past Medical History:  Diagnosis Date  . Acid reflux   . Diabetes mellitus without complication ([place])   . High cholesterol   . Hyperthyroidism   . Neuropathy   . Pancreatitis     Past Surgical History:  Procedure Laterality Date  . ABDOMINAL HYSTERECTOMY    . ABDOMINAL SURGERY    . TONSILLECTOMY      There were no vitals filed for this visit.  Subjective Assessment - 10/19/19 1026    Subjective  I felt good after last visit. Last 2 mornings I have been able to get to the bathroom. I have tried to increase the intake of water.    Patient Stated Goals  reduce urinary leakage    Currently in Pain?  No/denies                       [facility] Adult PT Treatment/Exercise - 10/19/19 0001      Lumbar Exercises: Aerobic   Recumbent Bike  level 1 for 5 minutes while assessing the patient      Lumbar Exercises: Seated   Other Seated Lumbar Exercises  seated with ball squeeze holding 10 seconds 10 times with pelvic floor contraction; pelvic tilt in sitting      Lumbar Exercises: Supine   Other Supine Lumbar Exercises  sitting on red physioball and contract the pelvic floor 10 seconds, 5  quick contractions, pelvic sway, pelvic circles, pelvic tilt,alternate shoulder flexion with pelvic floor contraction, raise heel and push hand into knee for 5 sec with pelvic floor contraction, then opposite hand to knee with pelvic floor contraction    Other Supine Lumbar Exercises  supine using the kegel master to strengthen the pelvic floor             PT Education - 10/19/19 1059    Education Details  pelvic floor contraction in sitting and using her pelvic floor master, what size ball to sit on    Person(s) Educated  Patient    Methods  Explanation;Demonstration;Verbal cues;Handout    Comprehension  Returned demonstration;Verbalized understanding       PT Short Term Goals - 10/19/19 1102      PT SHORT TERM GOAL #1   Title  independent with initial HEP    Time  4    Period  Weeks    Status  Achieved    Target Date  10/27/19      PT SHORT TERM GOAL #2   Title  unsderstand what bladder irritants are and how they affect the bladder  Time  4    Period  Weeks    Status  Achieved    Target Date  10/27/19      PT SHORT TERM GOAL #3   Title  understand how to manage vaginal dryness with coconut oil    Period  Weeks    Status  On-going        PT Long Term Goals - 09/29/19 1111      PT LONG TERM GOAL #1   Title  independent with advanced HEP    Time  12    Period  Weeks    Status  New    Target Date  12/22/19      PT LONG TERM GOAL #2   Title  understand how to perform the urge to void so she is able to make it to the commode in time    Time  12    Period  Weeks    Status  New    Target Date  12/22/19      PT LONG TERM GOAL #3   Title  pelvic floor strength >/= 3/5 holding for 10 seconds so her urinary leakage is >/= 75% better    Time  12    Period  Weeks    Status  New    Target Date  12/22/19      PT LONG TERM GOAL #4   Title  wears 1 light day pad during the day for just in case due to reduction of urinary leakage    Time  12    Period  Weeks     Status  New    Target Date  12/22/19            Plan - 10/19/19 1033    Clinical Impression Statement  Patient is able to get up and walk to the bathroom the past 2 mornings due to increased pelvic floor strength. Patient is able to do her exercises without increased pain. Patient is able to contract the pelvic floor for 10 seconds instead of 8. Patient was able to use the recumbent bike without difficulty. Patient is now drinking water more and notices less leakage. Patient will benefit from skilled therapy to improve leakage and improve strength.    Personal Factors and Comorbidities  Age;Fitness;Comorbidity 1    Comorbidities  neuropathy of left foot    Examination-Activity Limitations  Locomotion Level;Continence;Toileting    Stability/Clinical Decision Making  Evolving/Moderate complexity    Rehab Potential  Good    PT Frequency  1x / week    PT Duration  12 weeks    PT Treatment/Interventions  Biofeedback;Therapeutic activities;Therapeutic exercise;Patient/family education;Neuromuscular re-education;Manual techniques    PT Next Visit Plan  sitting on ball contracting the pelvic floor, ask about the vaginal dryness    Consulted and Agree with Plan of Care  Patient       Patient will benefit from skilled therapeutic intervention in order to improve the following deficits and impairments:  Decreased activity tolerance, Decreased strength, Decreased coordination  Visit Diagnosis: Muscle weakness (generalized)  Other lack of coordination  Urge incontinence     Problem List There are no active problems to display for this patient.   Eulis Foster, PT 10/19/19 11:03 AM   [facility] [address], [address] [place], [place], [address] Phone: [phone]   Fax:  [phone]  Name: Selena Long MRN: [id number] Date of Birth: October 10, 1956

## 2019-10-19 NOTE — Patient Instructions (Addendum)
Adduction: Hip - Knees Together With Pelvic Floor (Sitting)    Sit with towel roll between knees. Squeeze pelvic floor while pushing knees together. Hold for _8__ seconds. Rest for _5__ seconds. Repeat _10__ times. Do __2_ times a day.  Copyright  VHI. All rights reserved.  External Rotation: Hip - Knees Apart With Pelvic Floor (Sitting)    Sit, band tied just above knees. Squeeze pelvic floor while pulling knees apart. Hold for _3__ seconds. Rest for _3__ seconds. Repeat _10__ times. Do _2__ times a day.  Copyright  VHI. All rights reserved.  Bracing With Knee Fallout (Hook-Lying)    With neutral spine, tighten pelvic floor and abdominals and hold. Alternating legs, drop knee out to side. Keep opposite hip still. Repeat _10__ times. Do _1__ times a day. Each leg  Copyright  VHI. All rights reserved.  Bracing With Arm Lift (Hook-Lying)    With neutral spine, tighten pelvic floor and abdominals and hold. Alternating arms, raise over head and return to side. Repeat _15__ times. Do _1__ times a day.   Copyright  VHI. All rights reserved.  Pelvic Tilt (Sitting)    Sit on ball. Tighten pelvic floor and hold. Roll ball slightly forward by tilting pelvis backwards. Relax. Repeat __10_ times. Do __1Side Tilt (Sitting)    Sit on ball. Tighten pelvic floor and hold. Roll ball slightly to one side by tilting pelvis the opposite direction. Repeat to other side. Relax. Repeat _10__ times. Do __1_ times a day. Advanced: Continue to hold squeeze through repetitions.   Slow Contraction: Gravity Resisted (Sitting)    Sitting, slowly squeeze pelvic floor for _10__ seconds. Rest for _5__ seconds. Repeat _5__ times. Do _3__ times a day.  Copyright  VHI. All rights reserved.  While laying on back use the kegelmaster 15 times  [facility] [address], [place], [place] [address] Phone # [phone] Fax [phone]

## 2019-10-26 ENCOUNTER — Ambulatory Visit: Payer: No Typology Code available for payment source | Admitting: Physical Therapy

## 2019-10-27 ENCOUNTER — Other Ambulatory Visit: Payer: Self-pay

## 2019-10-27 ENCOUNTER — Encounter: Payer: Self-pay | Admitting: Physical Therapy

## 2019-10-27 ENCOUNTER — Ambulatory Visit: Payer: No Typology Code available for payment source | Admitting: Physical Therapy

## 2019-10-27 DIAGNOSIS — M6281 Muscle weakness (generalized): Secondary | ICD-10-CM | POA: Diagnosis not present

## 2019-10-27 DIAGNOSIS — R278 Other lack of coordination: Secondary | ICD-10-CM

## 2019-10-27 DIAGNOSIS — N3941 Urge incontinence: Secondary | ICD-10-CM

## 2019-10-27 NOTE — Therapy (Signed)
[facility] [address], [address] [place], [place], [address] Phone: [phone]   Fax:  [phone]  Physical Therapy Treatment  Patient Details  Name: Selena Long MRN: [id number] Date of Birth: 1956/05/04 Referring Provider (PT): Dr. Clint Guy   Encounter Date: 10/27/2019  PT End of Session - 10/27/19 1056    Visit Number  4    Date for PT Re-Evaluation  12/22/19    Authorization Type  VA    Authorization Time Period  09/15/19-04/15/2020    Authorization - Visit Number  4    Authorization - Number of Visits  15    PT Start Time  1015    PT Stop Time  1054    PT Time Calculation (min)  39 min    Activity Tolerance  Patient tolerated treatment well    Behavior During Therapy  [facility] for tasks assessed/performed       Past Medical History:  Diagnosis Date  . Acid reflux   . Diabetes mellitus without complication ([facility])   . High cholesterol   . Hyperthyroidism   . Neuropathy   . Pancreatitis     Past Surgical History:  Procedure Laterality Date  . ABDOMINAL HYSTERECTOMY    . ABDOMINAL SURGERY    . TONSILLECTOMY      There were no vitals filed for this visit.  Subjective Assessment - 10/27/19 1022    Subjective  I have been in alot of pain. I brought my ball to have it blowen up. Urinary leakage is 50% better. I am not feeling the vaginal dryness and I am using the cream.    Patient Stated Goals  reduce urinary leakage    Currently in Pain?  Yes    Pain Score  4     Pain Location  Back    Pain Orientation  Lower    Pain Descriptors / Indicators  Dull    Pain Type  Chronic pain    Pain Onset  More than a month ago    Pain Frequency  Constant    Aggravating Factors   weather, movement    Pain Relieving Factors  rest    Multiple Pain Sites  No         [facility] PT Assessment - 10/27/19 0001      Assessment   Medical Diagnosis  [id number] Urge incontinence    Referring Provider (PT)  Dr. Clint Guy    Onset Date/Surgical Date  --   chronic   Prior Therapy  none      AROM   Lumbar Extension  decreased by 50%      Strength   Right Hip Extension  4/5    Right Hip ABduction  3+/5    Right Hip ADduction  3+/5    Left Hip Extension  4/5    Left Hip ABduction  3+/5    Left Hip ADduction  4/5                   [facility] Adult PT Treatment/Exercise - 10/27/19 0001      Lumbar Exercises: Supine   Ab Set  20 reps    AB Set Limitations  while doing alternate shoulder flexion    Clam  10 reps;1 second    Clam Limitations  with yellow band; tactile cues to contract the lower abdominals    Bridge with [employer]  10 reps;1 second   with pelvic floor contraction   Bridge with [employer]  Squeeze Limitations  breath out with lift, increased back pain so not to do     Basic Lumbar Stabilization  10 reps;5 seconds    Basic Lumbar Stabilization Limitations  feet on ball, press arms into mat and contract abdominals and pelvic floor    Basic Lumbar Stabilization  10 reps;5 seconds   ball on abdomen and press down   Large Ball Abdominal Isometric  10 reps    Large Ball Abdominal Isometric Limitations  bringing ball to chest    Large Ball Oblique Isometric  10 reps   each side   Large Ball Oblique Isometric Limitations  bringing ball to opposite shoulder      Modalities   Modalities  Moist Heat      Moist Heat Therapy   Number Minutes Moist Heat  20 Minutes    Moist Heat Location  Lumbar Spine   while doing supine exercises              PT Short Term Goals - 10/27/19 1055      PT SHORT TERM GOAL #3   Title  understand how to manage vaginal dryness with coconut oil    Time  4    Period  Weeks    Status  Achieved    Target Date  10/27/19        PT Long Term Goals - 09/29/19 1111      PT LONG TERM GOAL #1   Title  independent with advanced HEP    Time  12    Period  Weeks    Status  New    Target Date  12/22/19      PT LONG TERM GOAL #2   Title  understand how  to perform the urge to void so she is able to make it to the commode in time    Time  12    Period  Weeks    Status  New    Target Date  12/22/19      PT LONG TERM GOAL #3   Title  pelvic floor strength >/= 3/5 holding for 10 seconds so her urinary leakage is >/= 75% better    Time  12    Period  Weeks    Status  New    Target Date  12/22/19      PT LONG TERM GOAL #4   Title  wears 1 light day pad during the day for just in case due to reduction of urinary leakage    Time  12    Period  Weeks    Status  New    Target Date  12/22/19            Plan - 10/27/19 1027    Clinical Impression Statement  Patient reports no vaginal dryness with using cocnut oil in the vulva area. Patient was in alot of back pain today so she did most of her exercises in supine with a hot pack Patient did her whole program. Patient reports her leakage is 50% better. Patient brought her physioball in so we could pump it up. Patient left knee was giving way today so we did not do any exercises in standing. Patient has no change in lumbar ROM and hip strength at this time. Patient will benefit from skilled therapy to improve leakage and strength.    Personal Factors and Comorbidities  Age;Fitness;Comorbidity 1    Comorbidities  neuropathy of left foot    Examination-Activity Limitations  Locomotion  Level;Continence;Toileting    Stability/Clinical Decision Making  Evolving/Moderate complexity    Rehab Potential  Good    PT Frequency  1x / week    PT Duration  12 weeks    PT Treatment/Interventions  Biofeedback;Therapeutic activities;Therapeutic exercise;Patient/family education;Neuromuscular re-education;Manual techniques    PT Next Visit Plan  work on internal tissue work to assist in better pelvic floor contraction; pelvic floor exercise    Consulted and Agree with Plan of Care  Patient       Patient will benefit from skilled therapeutic intervention in order to improve the following deficits and  impairments:  Decreased activity tolerance, Decreased strength, Decreased coordination  Visit Diagnosis: Muscle weakness (generalized)  Other lack of coordination  Urge incontinence     Problem List There are no problems to display for this patient.   Eulis FosterCheryl Julienne Vogler, PT 10/27/19 10:57 AM    [facility] [address] [place], [place], [address] Phone: [phone]   Fax:  [phone]  Name: Valda FaviaViolet Silsby MRN: [id number] Date of Birth: 02/29/1956

## 2019-11-02 ENCOUNTER — Ambulatory Visit: Payer: No Typology Code available for payment source | Admitting: Physical Therapy

## 2019-11-02 ENCOUNTER — Telehealth: Payer: Self-pay | Admitting: Physical Therapy

## 2019-11-02 NOTE — Telephone Encounter (Signed)
Called patient. She was not feeling well. She was throwing up all night. Therapist recommended to her to call her doctor if she does not feel better.  Selena Long, PT @12 /23/2020@ 11:18 AM

## 2019-11-09 ENCOUNTER — Other Ambulatory Visit: Payer: Self-pay

## 2019-11-09 ENCOUNTER — Encounter: Payer: Self-pay | Admitting: Physical Therapy

## 2019-11-09 ENCOUNTER — Ambulatory Visit: Payer: No Typology Code available for payment source | Admitting: Physical Therapy

## 2019-11-09 DIAGNOSIS — M6281 Muscle weakness (generalized): Secondary | ICD-10-CM | POA: Diagnosis not present

## 2019-11-09 DIAGNOSIS — R278 Other lack of coordination: Secondary | ICD-10-CM

## 2019-11-09 DIAGNOSIS — N3941 Urge incontinence: Secondary | ICD-10-CM

## 2019-11-09 NOTE — Patient Instructions (Addendum)
Sit to Stand: Phase 1    Sitting, squeeze pelvic floor and hold. Lean trunk forward. Repeat _5__ times. Do __3_ times a day.  Copyright  VHI. All rights reserved.  Slow Contraction: Gravity Resisted (Sitting)    Sitting, slowly squeeze pelvic floor for _5__ seconds. Rest for __5_ seconds. Repeat _5__ times. Do __5_ times a day.  Copyright  VHI. All rights reserved.  [facility] [address] [place], [place] [address] Phone # [phone] Fax [phone]

## 2019-11-09 NOTE — Therapy (Signed)
[facility] [address], [address] [place], [place], [address] Phone: [phone]   Fax:  [phone]  Physical Therapy Treatment  Patient Details  Name: Selena Long MRN: [id number] Date of Birth: 1956/06/30 Referring Provider (PT): Dr. Clint Guy   Encounter Date: 11/09/2019  PT End of Session - 11/09/19 1059    Visit Number  5    Date for PT Re-Evaluation  12/22/19    Authorization Type  [place]    Authorization Time Period  09/15/19-04/15/2020    Authorization - Visit Number  5    Authorization - Number of Visits  15    PT Start Time  1055    PT Stop Time  1138    PT Time Calculation (min)  43 min    Activity Tolerance  Patient tolerated treatment well    Behavior During Therapy  [facility] for tasks assessed/performed       Past Medical History:  Diagnosis Date  . Acid reflux   . Diabetes mellitus without complication (HCC)   . High cholesterol   . Hyperthyroidism   . Neuropathy   . Pancreatitis     Past Surgical History:  Procedure Laterality Date  . ABDOMINAL HYSTERECTOMY    . ABDOMINAL SURGERY    . TONSILLECTOMY      There were no vitals filed for this visit.  Subjective Assessment - 11/09/19 1057    Subjective  Everything coming along. Sometimes the leakage is good and not so good. I may not always drink alot of water. The number of pads depends on how much liquid she drinks.    Patient Stated Goals  reduce urinary leakage    Currently in Pain?  No/denies                    Pelvic Floor Special Questions - 11/09/19 0001    Biofeedback  sitting resting tone 1 uv, contract 3 sec at 12 uv, sit quick contraction 5 x 14 uv, contract 5 sec about 6 uv  but can go to 12 uv for 2 sec; stand to sit and back with strength decreaesd as come forward, tighten pelvc floor and lean forward; sit to stand and back with pelvci floor contraction; ; sit with heel raises contracting the pelvic floor and lower  abdominals, sit alternate shoulder flexion with pelvic floor contraction;     Biofeedback sensor type  Surface   vaginal               PT Education - 11/09/19 1140    Education Details  pelvic floor contraction in sitting, sit to stand    Person(s) Educated  Patient    Methods  Explanation;Demonstration;Verbal cues;Handout    Comprehension  Returned demonstration;Verbalized understanding       PT Short Term Goals - 10/27/19 1055      PT SHORT TERM GOAL #3   Title  understand how to manage vaginal dryness with coconut oil    Time  4    Period  Weeks    Status  Achieved    Target Date  10/27/19        PT Long Term Goals - 11/09/19 1143      PT LONG TERM GOAL #1   Title  independent with advanced HEP    Time  12    Period  Weeks    Status  On-going      PT LONG TERM GOAL #2   Title  understand  how to perform the urge to void so she is able to make it to the commode in time    Time  12    Period  Weeks    Status  On-going      PT LONG TERM GOAL #3   Title  pelvic floor strength >/= 3/5 holding for 10 seconds so her urinary leakage is >/= 75% better    Time  12    Period  Weeks    Status  On-going      PT LONG TERM GOAL #4   Title  wears 1 light day pad during the day for just in case due to reduction of urinary leakage    Time  12    Period  Weeks    Status  On-going            Plan - 11/09/19 1100    Clinical Impression Statement  Patient is able to contract the pelvic floor for 5 seconds in sitting at 12 uv. Patient is able to do quick flicks to 12 uv. Patient has some difficulty going from sitting upright to being forward while contracting the pelvic floor. Patient reports she has some improvement in pelvic floor contraction and depends on what she is drinking and how much. Patient will benefit from skilled therapy  to improve leakage and strength.    Personal Factors and Comorbidities  Age;Fitness;Comorbidity 1    Comorbidities  neuropathy of  left foot    Examination-Activity Limitations  Locomotion Level;Continence;Toileting    Stability/Clinical Decision Making  Evolving/Moderate complexity    Rehab Potential  Good    PT Frequency  1x / week    PT Duration  12 weeks    PT Treatment/Interventions  Biofeedback;Therapeutic activities;Therapeutic exercise;Patient/family education;Neuromuscular re-education;Manual techniques    PT Next Visit Plan  pelvic floor contraction in sitting for 10 sec, sit and lean forward, stand and contract using the pelvic floor EMG    Consulted and Agree with Plan of Care  Patient       Patient will benefit from skilled therapeutic intervention in order to improve the following deficits and impairments:  Decreased activity tolerance, Decreased strength, Decreased coordination  Visit Diagnosis: Muscle weakness (generalized)  Other lack of coordination  Urge incontinence     Problem List There are no problems to display for this patient.   Earlie Counts, PT 11/09/19 11:44 AM   [facility] [address], [place], [place], [address] Phone: [phone]   Fax:  [phone]  Name: Eldoris Beiser MRN: [id number] Date of Birth: 06-Mar-1956

## 2019-11-16 ENCOUNTER — Encounter: Payer: Self-pay | Admitting: Physical Therapy

## 2019-11-16 ENCOUNTER — Other Ambulatory Visit: Payer: Self-pay

## 2019-11-16 ENCOUNTER — Ambulatory Visit: Payer: No Typology Code available for payment source | Attending: Internal Medicine | Admitting: Physical Therapy

## 2019-11-16 DIAGNOSIS — M6281 Muscle weakness (generalized): Secondary | ICD-10-CM | POA: Insufficient documentation

## 2019-11-16 DIAGNOSIS — R278 Other lack of coordination: Secondary | ICD-10-CM | POA: Diagnosis present

## 2019-11-16 DIAGNOSIS — N3941 Urge incontinence: Secondary | ICD-10-CM | POA: Diagnosis present

## 2019-11-16 NOTE — Therapy (Signed)
[facility] [address], [address] [place], [place], [address] Phone: [phone]   Fax:  [phone]  Physical Therapy Treatment  Patient Details  Name: Selena Long MRN: [id number] Date of Birth: August 14, 1956 Referring Provider (PT): Dr. Clint Guy   Encounter Date: 11/16/2019  PT End of Session - 11/16/19 1134    Visit Number  6    Date for PT Re-Evaluation  12/22/19    Authorization Type  [facility]    Authorization Time Period  09/15/19-04/15/2020    Authorization - Visit Number  6    Authorization - Number of Visits  15    PT Start Time  1100    PT Stop Time  1140    PT Time Calculation (min)  40 min    Activity Tolerance  Patient tolerated treatment well    Behavior During Therapy  [facility] for tasks assessed/performed       Past Medical History:  Diagnosis Date  . Acid reflux   . Diabetes mellitus without complication (HCC)   . High cholesterol   . Hyperthyroidism   . Neuropathy   . Pancreatitis     Past Surgical History:  Procedure Laterality Date  . ABDOMINAL HYSTERECTOMY    . ABDOMINAL SURGERY    . TONSILLECTOMY      There were no vitals filed for this visit.  Subjective Assessment - 11/16/19 1105    Subjective  I am dragging today and my back is hurting.    Patient Stated Goals  reduce urinary leakage    Currently in Pain?  Yes    Pain Score  4     Pain Location  Back    Pain Orientation  Lower    Pain Descriptors / Indicators  Dull    Pain Type  Chronic pain    Pain Onset  More than a month ago    Pain Frequency  Constant    Aggravating Factors   weather, movement    Pain Relieving Factors  rest    Multiple Pain Sites  No                    Pelvic Floor Special Questions - 11/16/19 0001    Biofeedback  supine plevic floor contraction contract to 25 uv for 10 secondsquick flicks to 35 uv, sitting resting tone is 1.5 uv, contract 10 sec above 12 uv , 2 quick onctractiona and 2 5 second hold  contractions in sitting    Biofeedback sensor type  Surface   vaginal       [facility] Adult PT Treatment/Exercise - 11/16/19 0001      Lumbar Exercises: Aerobic   Recumbent Bike  level 1 for 5 minutes while assessing the patient      Modalities   Modalities  Moist Heat      Moist Heat Therapy   Number Minutes Moist Heat  20 Minutes    Moist Heat Location  Lumbar Spine   while on bike and supine with exercise              PT Short Term Goals - 10/27/19 1055      PT SHORT TERM GOAL #3   Title  understand how to manage vaginal dryness with coconut oil    Time  4    Period  Weeks    Status  Achieved    Target Date  10/27/19        PT Long Term Goals - 11/16/19  [place] #1   Title  independent with advanced HEP    Time  12    Period  Weeks    Status  On-going      PT LONG TERM GOAL #3   Title  pelvic floor strength >/= 3/5 holding for 10 seconds so her urinary leakage is >/= 75% better    Baseline  leakage is 5% better and depends on the intake    Time  12    Period  Weeks    Status  On-going      PT LONG TERM GOAL #4   Title  wears 1 light day pad during the day for just in case due to reduction of urinary leakage    Baseline  wears 2 pads or 3 pads per day    Time  12    Period  Weeks    Status  On-going            Plan - 11/16/19 1135    Clinical Impression Statement  Patient reports she is wearing 2-3 pads per day depending on what she drinks. Patient resting tone in supine and sitting 1.5uv, Patient is able to contract for 10 seconds above 12 uv in supine and sitting. Patient is able to quickly contract and fully relax in sittng and supine. Patient will fatique in the middle of her 5 second contraction in sitting. Patient is able to quickly contract and relax after holding a contraction. Patient will benefit from skilled therapy to improve strength and reduce urinary leakage.    Personal Factors and Comorbidities   Age;Fitness;Comorbidity 1    Examination-Activity Limitations  Locomotion Level;Continence;Toileting    Stability/Clinical Decision Making  Evolving/Moderate complexity    Rehab Potential  Good    PT Frequency  1x / week    PT Duration  12 weeks    PT Treatment/Interventions  Biofeedback;Therapeutic activities;Therapeutic exercise;Patient/family education;Neuromuscular re-education;Manual techniques    PT Next Visit Plan  pelvic floor contraction in sitting for 10 sec, sit and lean forward, stand and contract using the pelvic floor EMG    Recommended Other Services  Ashly working on getting note signed    Consulted and Agree with Plan of Care  Patient       Patient will benefit from skilled therapeutic intervention in order to improve the following deficits and impairments:  Decreased activity tolerance, Decreased strength, Decreased coordination  Visit Diagnosis: Muscle weakness (generalized)  Other lack of coordination  Urge incontinence     Problem List There are no problems to display for this patient.   Earlie Counts, PT 11/16/19 11:43 AM   [facility] [address], [place], [place], [address] Phone: [phone]   Fax:  [phone]  Name: Sheryll Dymek MRN: [id number] Date of Birth: 09/16/1956

## 2019-11-23 ENCOUNTER — Encounter: Payer: Self-pay | Admitting: Physical Therapy

## 2019-11-23 ENCOUNTER — Ambulatory Visit: Payer: No Typology Code available for payment source | Admitting: Physical Therapy

## 2019-11-23 ENCOUNTER — Other Ambulatory Visit: Payer: Self-pay

## 2019-11-23 DIAGNOSIS — N3941 Urge incontinence: Secondary | ICD-10-CM

## 2019-11-23 DIAGNOSIS — R278 Other lack of coordination: Secondary | ICD-10-CM

## 2019-11-23 DIAGNOSIS — M6281 Muscle weakness (generalized): Secondary | ICD-10-CM

## 2019-11-23 NOTE — Therapy (Signed)
[facility] [address], [address] [place], [place], [address] Phone: [phone]   Fax:  [phone]  Physical Therapy Treatment  Patient Details  Name: Selena Long MRN: [id number] Date of Birth: 07-21-1956 Referring Provider (PT): Dr. Clint Guy   Encounter Date: 11/23/2019  PT End of Session - 11/23/19 1201    Visit Number  7    Date for PT Re-Evaluation  12/22/19    Authorization Type  [facility]    Authorization Time Period  09/15/19-04/15/2020    Authorization - Visit Number  7    Authorization - Number of Visits  15    PT Start Time  1145    PT Stop Time  1230    PT Time Calculation (min)  45 min    Activity Tolerance  Patient tolerated treatment well    Behavior During Therapy  [facility] for tasks assessed/performed       Past Medical History:  Diagnosis Date  . Acid reflux   . Diabetes mellitus without complication (HCC)   . High cholesterol   . Hyperthyroidism   . Neuropathy   . Pancreatitis     Past Surgical History:  Procedure Laterality Date  . ABDOMINAL HYSTERECTOMY    . ABDOMINAL SURGERY    . TONSILLECTOMY      There were no vitals filed for this visit.  Subjective Assessment - 11/23/19 1154    Subjective  My nerve pain in left foot is bothering me.    Patient Stated Goals  reduce urinary leakage    Currently in Pain?  No/denies                    Pelvic Floor Special Questions - 11/23/19 0001    Biofeedback  sitting resting level 1.5 uv; quick flicks to 7 uv; sitting hold 5 sec at 6 uv, contract for 10 sec between 4 and 7.5 uv    Biofeedback sensor type  Surface   vaginal       [facility] Adult PT Treatment/Exercise - 11/23/19 0001      Self-Care   Self-Care  Other Self-Care Comments    Other Self-Care Comments   education to use coconut oil on the vulva daily and 2 times per week internally for keeping moisture      Lumbar Exercises: Aerobic   Recumbent Bike  level 1 for 5 minutes  while assessing the patient             PT Education - 11/23/19 1238    Education Details  pelvic floor contraction 10 sec, 10 times 5 times per day    Person(s) Educated  Patient    Methods  Explanation;Demonstration;Verbal cues    Comprehension  Returned demonstration;Verbalized understanding       PT Short Term Goals - 10/27/19 1055      PT SHORT TERM GOAL #3   Title  understand how to manage vaginal dryness with coconut oil    Time  4    Period  Weeks    Status  Achieved    Target Date  10/27/19        PT Long Term Goals - 11/23/19 1201      PT LONG TERM GOAL #1   Title  independent with advanced HEP    Time  12    Period  Weeks    Status  On-going      PT LONG TERM GOAL #2   Title  understand how to  perform the urge to void so she is able to make it to the commode in time    Baseline  most of the time able to make it to the bathroom and urinate just over the commode    Time  12    Period  Weeks    Status  On-going      PT LONG TERM GOAL #3   Title  pelvic floor strength >/= 3/5 holding for 10 seconds so her urinary leakage is >/= 75% better    Baseline  leakage is 30% better and depends on the intake    Period  Weeks    Status  On-going      PT LONG TERM GOAL #4   Title  wears 1 light day pad during the day for just in case due to reduction of urinary leakage    Baseline  wears 2 pads or 3 pads per day    Time  12    Period  Weeks    Status  On-going            Plan - 11/23/19 1239    Clinical Impression Statement  Patient reports her urinary leakage is 30% better. Patient continues to wear 2-3 pads per day. Patient reports she has better control of the urge to void now. Patient is able to hold a pelvic floor contraction for 10 seconds at 5 uv. Patient does need verbal cues to not contract her gluteal and to breath. Patient was able to ride the bike with greater ease today and keep it at a certain level to keep bike on. Patient will benefit from  skilled therapy to improve strength and reduce urinary leakage.    Personal Factors and Comorbidities  Age;Fitness;Comorbidity 1    Comorbidities  neuropathy of left foot    Examination-Activity Limitations  Locomotion Level;Continence;Toileting    Stability/Clinical Decision Making  Evolving/Moderate complexity    Rehab Potential  Good    PT Frequency  1x / week    PT Duration  12 weeks    PT Treatment/Interventions  Biofeedback;Therapeutic activities;Therapeutic exercise;Patient/family education;Neuromuscular re-education;Manual techniques    PT Next Visit Plan  pelvic floor contraction in sitting for 10 sec, sit and lean forward, stand and contract using the pelvic floor EMG    Consulted and Agree with Plan of Care  Patient       Patient will benefit from skilled therapeutic intervention in order to improve the following deficits and impairments:  Decreased activity tolerance, Decreased strength, Decreased coordination  Visit Diagnosis: Muscle weakness (generalized)  Other lack of coordination  Urge incontinence     Problem List There are no problems to display for this patient.   Earlie Counts, PT 11/23/19 12:44 PM   [facility] [address] [place], [place], [address] Phone: [phone]   Fax:  [phone]  Name: Allee Busk MRN: [id number] Date of Birth: 1956/10/06

## 2019-11-30 ENCOUNTER — Other Ambulatory Visit: Payer: Self-pay

## 2019-11-30 ENCOUNTER — Ambulatory Visit: Payer: No Typology Code available for payment source | Admitting: Physical Therapy

## 2019-11-30 DIAGNOSIS — M6281 Muscle weakness (generalized): Secondary | ICD-10-CM

## 2019-11-30 DIAGNOSIS — R278 Other lack of coordination: Secondary | ICD-10-CM

## 2019-11-30 DIAGNOSIS — N3941 Urge incontinence: Secondary | ICD-10-CM

## 2019-11-30 NOTE — Therapy (Signed)
[facility] [address] [place], [place], [address] Phone: [phone]   Fax:  [phone]  Physical Therapy Treatment  Patient Details  Name: Selena Long MRN: [id number] Date of Birth: [birth year] Referring Provider (PT): Dr. Nicky Pugh   Encounter Date: 11/30/2019  PT End of Session - 11/30/19 1206    Visit Number  8    Date for PT Re-Evaluation  12/22/19    Authorization Type  [facility]    Authorization Time Period  09/15/19-04/15/2020    Authorization - Visit Number  8    Authorization - Number of Visits  15    PT Start Time  5329   came late   PT Stop Time  1230    PT Time Calculation (min)  25 min    Activity Tolerance  Patient tolerated treatment well    Behavior During Therapy  [facility] for tasks assessed/performed       Past Medical History:  Diagnosis Date  . Acid reflux   . Diabetes mellitus without complication ([place])   . High cholesterol   . Hyperthyroidism   . Neuropathy   . Pancreatitis     Past Surgical History:  Procedure Laterality Date  . ABDOMINAL HYSTERECTOMY    . ABDOMINAL SURGERY    . TONSILLECTOMY      There were no vitals filed for this visit.      [facility] PT Assessment - 11/30/19 0001      Strength   Right Hip Extension  4+/5    Right Hip ABduction  4-/5    Right Hip ADduction  5/5    Left Hip Extension  4+/5    Left Hip ABduction  4/5    Left Hip ADduction  5/5                   [facility] Adult PT Treatment/Exercise - 11/30/19 0001      Lumbar Exercises: Aerobic   Recumbent Bike  level 2 for 7 minutes while assessing the patient      Lumbar Exercises: Supine   Bridge  20 reps      Lumbar Exercises: Prone   Straight Leg Raise  20 reps;1 second    Straight Leg Raises Limitations  each leg with breath    Other Prone Lumbar Exercises  prone hip IR/ER ; prone hip abduction 15 times each side               PT Short Term Goals - 10/27/19 1055      PT  SHORT TERM GOAL #3   Title  understand how to manage vaginal dryness with coconut oil    Time  4    Period  Weeks    Status  Achieved    Target Date  10/27/19        PT Long Term Goals - 11/30/19 1224      PT LONG TERM GOAL #1   Title  independent with advanced HEP    Time  12    Period  Weeks    Status  On-going      PT LONG TERM GOAL #2   Title  understand how to perform the urge to void so she is able to make it to the commode in time    Time  12    Period  Weeks    Status  Achieved      PT LONG TERM GOAL #3   Title  pelvic floor strength >/=  3/5 holding for 10 seconds so her urinary leakage is >/= 75% better    Baseline  leakage is 40% better and depends on the intake    Time  12    Period  Weeks    Status  On-going      PT LONG TERM GOAL #4   Title  wears 1 light day pad during the day for just in case due to reduction of urinary leakage    Baseline  wears 2 pads or 3 pads per day    Time  12    Period  Weeks    Status  On-going            Plan - 11/30/19 1226    Clinical Impression Statement  Patient reports her urinary leakage is 40% better. Patient is able to get up in the morning and to the toilet without leaking urine. Patient has increased in hip strength. Patient came late due to talking to her doctor on the phone. Patient was able to do her exercises without increased pain. Patient will benefit from skilled therapy to improve strength and reduce urinary leakage.    Personal Factors and Comorbidities  Age;Fitness;Comorbidity 1    Comorbidities  neuropathy of left foot    Examination-Activity Limitations  Locomotion Level;Continence;Toileting    Stability/Clinical Decision Making  Evolving/Moderate complexity    Rehab Potential  Good    PT Frequency  1x / week    PT Duration  12 weeks    PT Treatment/Interventions  Biofeedback;Therapeutic activities;Therapeutic exercise;Patient/family education;Neuromuscular re-education;Manual techniques    PT Next  Visit Plan  pelvic floor contraction in sitting for 10 sec, sit and lean forward, stand and contract using the pelvic floor EMG    Consulted and Agree with Plan of Care  Patient       Patient will benefit from skilled therapeutic intervention in order to improve the following deficits and impairments:  Decreased activity tolerance, Decreased strength, Decreased coordination  Visit Diagnosis: Muscle weakness (generalized)  Other lack of coordination  Urge incontinence     Problem List There are no problems to display for this patient.   Eulis Foster, PT 11/30/19 12:30 PM   [facility] [address], [address] [place], [place], [address] Phone: [phone]   Fax:  [phone]  Name: Sayre Mazor MRN: [id number] Date of Birth: [birth year]

## 2019-12-07 ENCOUNTER — Ambulatory Visit: Payer: No Typology Code available for payment source | Admitting: Physical Therapy

## 2019-12-07 ENCOUNTER — Encounter: Payer: Self-pay | Admitting: Physical Therapy

## 2019-12-07 ENCOUNTER — Other Ambulatory Visit: Payer: Self-pay

## 2019-12-07 DIAGNOSIS — R278 Other lack of coordination: Secondary | ICD-10-CM

## 2019-12-07 DIAGNOSIS — N3941 Urge incontinence: Secondary | ICD-10-CM

## 2019-12-07 DIAGNOSIS — M6281 Muscle weakness (generalized): Secondary | ICD-10-CM

## 2019-12-07 NOTE — Therapy (Signed)
[facility] [address], [place], [place], [address] Phone: [phone]   Fax:  [phone]  Physical Therapy Treatment  Patient Details  Name: Selena Long MRN: [id number] Date of Birth: 04-25-1956 Referring Provider (PT): Dr. Nicky Pugh   Encounter Date: 12/07/2019  PT End of Session - 12/07/19 1110    Visit Number  9    Date for PT Re-Evaluation  12/22/19    Authorization Type  [facility]    Authorization Time Period  09/15/19-04/15/2020    Authorization - Visit Number  9    Authorization - Number of Visits  15    PT Start Time  1100    PT Stop Time  1140    PT Time Calculation (min)  40 min    Activity Tolerance  Patient tolerated treatment well    Behavior During Therapy  [facility] for tasks assessed/performed       Past Medical History:  Diagnosis Date  . Acid reflux   . Diabetes mellitus without complication ([place])   . High cholesterol   . Hyperthyroidism   . Neuropathy   . Pancreatitis     Past Surgical History:  Procedure Laterality Date  . ABDOMINAL HYSTERECTOMY    . ABDOMINAL SURGERY    . TONSILLECTOMY      There were no vitals filed for this visit.  Subjective Assessment - 12/07/19 1106    Subjective  I was sore after last visit. I do better if I do not drink coffee, tea and soda. I have less urgency. When not drinking the irritants I am down 1 less pad.    Patient Stated Goals  reduce urinary leakage    Currently in Pain?  No/denies                    Pelvic Floor Special Questions - 12/07/19 0001    Biofeedback  sitting resting tone 1 uv, quick contraction 20 uv, 10 sec 12 uv, trapezoid program in sitting, standing  resting tone is 3 uv, contract quickly at 25 uv, standing 10 sec 15 uv withsome fatique    Biofeedback sensor type  Surface   vaginal       [facility] Adult PT Treatment/Exercise - 12/07/19 0001      Lumbar Exercises: Aerobic   Recumbent Bike  level 2 for 5 minutes while  assessing the patient               PT Short Term Goals - 10/27/19 1055      PT SHORT TERM GOAL #3   Title  understand how to manage vaginal dryness with coconut oil    Time  4    Period  Weeks    Status  Achieved    Target Date  10/27/19        PT Long Term Goals - 12/07/19 1139      PT LONG TERM GOAL #1   Title  independent with advanced HEP    Time  12    Period  Weeks    Status  On-going      PT LONG TERM GOAL #2   Title  understand how to perform the urge to void so she is able to make it to the commode in time    Time  12    Period  Weeks    Status  Achieved      PT LONG TERM GOAL #3   Title  pelvic floor strength >/=  3/5 holding for 10 seconds so her urinary leakage is >/= 75% better    Baseline  leakage is 40% better and depends on the intake    Time  12    Period  Weeks    Status  On-going      PT LONG TERM GOAL #4   Title  wears 1 light day pad during the day for just in case due to reduction of urinary leakage    Baseline  wears 2 pads    Time  12    Period  Weeks    Status  On-going            Plan - 12/07/19 1111    Clinical Impression Statement  Standing contract 10 sec at 15 uv with some fatique. Patient resting level for pelvic floor is 1 uv. Patient is able to do a quick flict at 15  uv instead of 7 uv. Patient is able to contract to 10 seconds at 12 uv. Patient is learning how to control the pelvic floor muscle with going up slowly and coming down slowly. Patient will leak more urine with drinking tea, soda, coffee due to them being bladder irrtants. Patient will benefit from skilled therapy to improve strength and reduce urinary leakage.    Personal Factors and Comorbidities  Age;Fitness;Comorbidity 1    Comorbidities  neuropathy of left foot    Examination-Activity Limitations  Locomotion Level;Continence;Toileting    Stability/Clinical Decision Making  Evolving/Moderate complexity    Rehab Potential  Good    PT Frequency  1x /  week    PT Duration  12 weeks    PT Treatment/Interventions  Biofeedback;Therapeutic activities;Therapeutic exercise;Patient/family education;Neuromuscular re-education;Manual techniques    PT Next Visit Plan  pelvic floor contraction in sitting for 15 sec, sit and lean forward, stand and contract using the pelvic floor EMG    Consulted and Agree with Plan of Care  Patient       Patient will benefit from skilled therapeutic intervention in order to improve the following deficits and impairments:  Decreased activity tolerance, Decreased strength, Decreased coordination  Visit Diagnosis: Muscle weakness (generalized)  Other lack of coordination  Urge incontinence     Problem List There are no problems to display for this patient.   Eulis Foster, PT 12/07/19 11:41 AM   [facility] [address], [address] [place], [place], [address] Phone: [phone]   Fax:  [phone]  Name: Azzure Garabedian MRN: [id number] Date of Birth: [birth year]

## 2019-12-14 ENCOUNTER — Encounter: Payer: Non-veteran care | Admitting: Physical Therapy

## 2019-12-21 ENCOUNTER — Other Ambulatory Visit: Payer: Self-pay

## 2019-12-21 ENCOUNTER — Encounter: Payer: Self-pay | Admitting: Physical Therapy

## 2019-12-21 ENCOUNTER — Ambulatory Visit: Payer: No Typology Code available for payment source | Attending: Internal Medicine | Admitting: Physical Therapy

## 2019-12-21 DIAGNOSIS — N3941 Urge incontinence: Secondary | ICD-10-CM

## 2019-12-21 DIAGNOSIS — R278 Other lack of coordination: Secondary | ICD-10-CM | POA: Insufficient documentation

## 2019-12-21 DIAGNOSIS — M6281 Muscle weakness (generalized): Secondary | ICD-10-CM | POA: Insufficient documentation

## 2019-12-21 NOTE — Patient Instructions (Addendum)
Slow Contraction: Gravity Resisted (Sitting)    Sitting, slowly squeeze pelvic floor for _10__ seconds. Rest for _10__ seconds. Repeat _5__ times. Do _3__ times a day.  Copyright  VHI. All rights reserved.  Quick Contraction: Gravity Resisted (Sitting)    Sitting, quickly squeeze then fully relax pelvic floor. Perform __1_ sets of _5__. Rest for ___1 seconds between sets. Do __3_ times a day.  Copyright  VHI. All rights reserved.  Cough: Phase 3 (Supine)    Lie flat. Squeeze pelvic floor and hold. Inhale. Lift head and shoulders. Cough. Relax. Repeat __3_ times. Do __1_ times a day.   Copyright  VHI. All rights reserved.  [facility] [address] [place], [place] [address] Phone # [phone] Fax [phone]

## 2019-12-21 NOTE — Therapy (Signed)
[facility] [address], [address] [place], [place], [address] Phone: [phone]   Fax:  [phone]  Physical Therapy Treatment  Patient Details  Name: Selena Long MRN: [id number] Date of Birth: [birth year] Referring Provider (PT): Dr. Clint Guy   Encounter Date: 12/21/2019  PT End of Session - 12/21/19 1159    Visit Number  10    Date for PT Re-Evaluation  01/25/20    Authorization Time Period  09/15/19-04/15/2020    Authorization - Visit Number  10    Authorization - Number of Visits  15    PT Start Time  1145    PT Stop Time  1225    PT Time Calculation (min)  40 min    Activity Tolerance  Patient tolerated treatment well    Behavior During Therapy  [facility] for tasks assessed/performed       Past Medical History:  Diagnosis Date  . Acid reflux   . Diabetes mellitus without complication ([facility])   . High cholesterol   . Hyperthyroidism   . Neuropathy   . Pancreatitis     Past Surgical History:  Procedure Laterality Date  . ABDOMINAL HYSTERECTOMY    . ABDOMINAL SURGERY    . TONSILLECTOMY      There were no vitals filed for this visit.  Subjective Assessment - 12/21/19 1155    Subjective  I feel like things are improving. Some days I will do my exercises and some days I will not. I missed last week due to having to watch my cousins kids. Yesterday I only wore 1 pad and had no leakage. I want to get comfortable to not wear a pad.    Patient Stated Goals  reduce urinary leakage    Currently in Pain?  No/denies         [facility] PT Assessment - 12/21/19 0001      Assessment   Medical Diagnosis  [id number] Urge incontinence    Referring Provider (PT)  Dr. Clint Guy    Prior Therapy  none      Precautions   Precautions  None      Restrictions   Weight Bearing Restrictions  No      Home Environment   Living Environment  Private residence      Prior Function   Level of Independence  Independent      Cognition   Overall Cognitive Status  Within Functional Limits for tasks assessed      Strength   Right Hip Extension  5/5    Right Hip ABduction  4/5    Right Hip ADduction  5/5    Left Hip Extension  5/5    Left Hip ABduction  4+/5    Left Hip ADduction  5/5                Pelvic Floor Special Questions - 12/21/19 0001    Pelvic Floor Internal Exam  Patient confirms identification and approves PT to assess pelvic floor and treatment    Exam Type  Vaginal    Strength  fair squeeze, definite lift        [facility] Adult PT Treatment/Exercise - 12/21/19 0001      Neuro Re-ed    Neuro Re-ed Details   pelvic floor contraction with tactile cues holding for 6 seconds 10x, contract lift head and cough, sitting and contract pelvic floor 10x hold 10 sec. then 5 quick flikcs      Lumbar Exercises: Aerobic  Recumbent Bike  level 2 for 5 minutes while assessing the patient      Manual Therapy   Manual Therapy  Internal Pelvic Floor    Internal Pelvic Floor  fascial release to the urethra sphincter, stroking 50x on each side of the introitus             PT Education - 12/21/19 1228    Education Details  pelvic floor contraction is sitting, contract pelvic floor in supine and cough    Person(s) Educated  Patient    Methods  Explanation;Demonstration;Verbal cues;Handout    Comprehension  Returned demonstration;Verbalized understanding       PT Short Term Goals - 10/27/19 1055      PT SHORT TERM GOAL #3   Title  understand how to manage vaginal dryness with coconut oil    Time  4    Period  Weeks    Status  Achieved    Target Date  10/27/19        PT Long Term Goals - 12/21/19 1207      PT LONG TERM GOAL #1   Title  independent with advanced HEP    Baseline  still learning    Time  12    Period  Weeks    Status  On-going      PT LONG TERM GOAL #2   Title  understand how to perform the urge to void so she is able to make it to the commode in time    Time   12    Period  Weeks    Status  Achieved      PT LONG TERM GOAL #3   Title  pelvic floor strength >/= 3/5 holding for 10 seconds so her urinary leakage is >/= 75% better    Baseline  leakage is 55% better and depends on the intake    Time  12    Period  Weeks    Status  On-going      PT LONG TERM GOAL #4   Title  wears 1 light day pad during the day for just in case due to reduction of urinary leakage    Baseline  wears 1 pad and is damp    Time  12    Period  Weeks    Status  On-going            Plan - 12/21/19 1228    Clinical Impression Statement  Patient leakage has improve by 55%. Patient wears 1 pad per day and some days the pad is dry. Patient pelvic floor strength has increased to 3/5 holding for 6 seconds. Patient has some tightness in the urethra sphincter and sides of the introitus. Patient is able to not bulge down when she is in supine and cough. Patient has increased strength of hips. Patient reports she is depressed with not having her own home, not being able to exercise at the gym and the COVID issue. Patient will benefit from skilled therapy to improve strength and reduce urinary leakage.    Personal Factors and Comorbidities  Age;Fitness;Comorbidity 1    Comorbidities  neuropathy of left foot    Examination-Activity Limitations  Locomotion Level;Continence;Toileting    Stability/Clinical Decision Making  Evolving/Moderate complexity    Rehab Potential  Good    PT Frequency  1x / week    PT Duration  --   5 weeks   PT Treatment/Interventions  Biofeedback;Therapeutic activities;Therapeutic exercise;Patient/family education;Neuromuscular re-education;Manual techniques    PT  Next Visit Plan  pelvic floor contraction in sitting for 15 sec, sit and lean forward, stand and contract using the pelvic floor EMG    Consulted and Agree with Plan of Care  Patient       Patient will benefit from skilled therapeutic intervention in order to improve the following deficits  and impairments:  Decreased activity tolerance, Decreased strength, Decreased coordination  Visit Diagnosis: Muscle weakness (generalized) - Plan: PT plan of care cert/re-cert  Other lack of coordination - Plan: PT plan of care cert/re-cert  Urge incontinence - Plan: PT plan of care cert/re-cert     Problem List There are no problems to display for this patient.   Earlie Counts, PT 12/21/19 12:34 PM   [facility] [address] [place], [place], [address] Phone: [phone]   Fax:  [phone]  Name: Selena Long MRN: [id number] Date of Birth: 1956-03-15

## 2019-12-28 ENCOUNTER — Encounter: Payer: Non-veteran care | Admitting: Physical Therapy

## 2020-01-04 ENCOUNTER — Encounter: Payer: Self-pay | Admitting: Physical Therapy

## 2020-01-04 ENCOUNTER — Ambulatory Visit: Payer: No Typology Code available for payment source | Admitting: Physical Therapy

## 2020-01-04 ENCOUNTER — Other Ambulatory Visit: Payer: Self-pay

## 2020-01-04 DIAGNOSIS — M6281 Muscle weakness (generalized): Secondary | ICD-10-CM

## 2020-01-04 DIAGNOSIS — N3941 Urge incontinence: Secondary | ICD-10-CM

## 2020-01-04 DIAGNOSIS — R278 Other lack of coordination: Secondary | ICD-10-CM

## 2020-01-04 NOTE — Therapy (Signed)
[facility] [address], [place], [place], [address] Phone: [phone]   Fax:  [phone]  Physical Therapy Treatment  Patient Details  Name: Selena Long MRN: [id number] Date of Birth: [birth year] Referring Provider (PT): Dr. Nicky Pugh   Encounter Date: 01/04/2020  PT End of Session - 01/04/20 1153    Visit Number  11    Date for PT Re-Evaluation  01/25/20    Authorization Type  [facility]    Authorization Time Period  09/15/19-04/15/2020    Authorization - Visit Number  11    Authorization - Number of Visits  15    PT Start Time  0175    PT Stop Time  1225    PT Time Calculation (min)  40 min    Activity Tolerance  Patient tolerated treatment well    Behavior During Therapy  [facility] for tasks assessed/performed       Past Medical History:  Diagnosis Date  . Acid reflux   . Diabetes mellitus without complication ([place])   . High cholesterol   . Hyperthyroidism   . Neuropathy   . Pancreatitis     Past Surgical History:  Procedure Laterality Date  . ABDOMINAL HYSTERECTOMY    . ABDOMINAL SURGERY    . TONSILLECTOMY      There were no vitals filed for this visit.  Subjective Assessment - 01/04/20 1158    Subjective  I am doing pretty good. When I drink the water I am not leaking. When I drink the bladder irritants I will leak. In the morning I will urinate  for a long time.    Patient Stated Goals  reduce urinary leakage    Currently in Pain?  No/denies                       [facility] Adult PT Treatment/Exercise - 01/04/20 0001      Neuro Re-ed    Neuro Re-ed Details   sit on green physioball-pelvic sway, pelvic circles, alternate shoulder flexion, resistive hip flexion, shoulder horizontal abduction with yellow band, sit on ball and lean back for abdominals, shoulder ER with yellow band, shoulder diagonals with yellow band, supine knees to chest with breathing out, press legs into ball, roll ball side  to side to work obliques   pelvic floor contraction with exercise     Lumbar Exercises: Aerobic   Recumbent Bike  level 2 for 6 minutes while assessing the patient             PT Education - 01/04/20 1230    Education Details  Access Code: [email or web address]    Person(s) Educated  Patient    Methods  Explanation;Demonstration;Verbal cues;Handout    Comprehension  Verbalized understanding;Returned demonstration       PT Short Term Goals - 10/27/19 1055      PT SHORT TERM GOAL #3   Title  understand how to manage vaginal dryness with coconut oil    Time  4    Period  Weeks    Status  Achieved    Target Date  10/27/19        PT Long Term Goals - 01/04/20 1200      PT LONG TERM GOAL #3   Title  pelvic floor strength >/= 3/5 holding for 10 seconds so her urinary leakage is >/= 75% better    Baseline  leakage is 60% better and depends on the intake    Time  12    Period  Weeks    Status  On-going      PT LONG TERM GOAL #4   Title  wears 1 light day pad during the day for just in case due to reduction of urinary leakage    Baseline  wears 1 pad and is damp    Time  12    Period  Weeks    Status  On-going            Plan - 01/04/20 1207    Clinical Impression Statement  Patient reports her urinary leakage is 60% better. She is using 1 light pad per day. Patient will not leak urine when she drinks water but will leak when she drinks bladder irritants. Patient will benefit from skilled therapy to improve strength and reduce urinary leakage.    Personal Factors and Comorbidities  Age;Fitness;Comorbidity 1    Comorbidities  neuropathy of left foot    Examination-Activity Limitations  Locomotion Level;Continence;Toileting    Stability/Clinical Decision Making  Evolving/Moderate complexity    Rehab Potential  Good    PT Frequency  1x / week    PT Duration  --   5 weeks   PT Treatment/Interventions  Biofeedback;Therapeutic activities;Therapeutic exercise;Patient/family  education;Neuromuscular re-education;Manual techniques    PT Next Visit Plan  review HEP    Consulted and Agree with Plan of Care  Patient       Patient will benefit from skilled therapeutic intervention in order to improve the following deficits and impairments:  Decreased activity tolerance, Decreased strength, Decreased coordination  Visit Diagnosis: Muscle weakness (generalized)  Other lack of coordination  Urge incontinence     Problem List There are no problems to display for this patient.   Eulis Foster, PT 01/04/20 12:32 PM   [facility] [address], [address] [place], [place], [address] Phone: [phone]   Fax:  [phone]  Name: Selena Long MRN: [id number] Date of Birth: [birth year]

## 2020-01-04 NOTE — Patient Instructions (Signed)
Access Code: [email or web address]  URL: [email or web address]  Date: 01/04/2020  Prepared by: Eulis Foster   Exercises Seated Swiss Ball Pelvic Circles - 10 reps - 1 sets - 1x daily - 7x weekly Seated Lateral Pelvic Tilt on Swiss Ball - 10 reps - 1 sets - 1x daily                            - 7x weekly Alternating Shoulder Flexion Seated on Swiss Ball - 10 reps - 1 sets - 1x daily - 7x weekly Seated Pelvic Floor Contraction on Swiss Ball - 10 reps - 1 sets - 5 sec hold - 1x daily - 7x weekly Seated Shoulder W with Scaption on Swiss Ball - 10 reps - 1 sets - 1x daily - 7x weekly Seated Lean Back on Swiss Ball - 10 reps - 1 sets - 1x daily - 7x weekly Seated Shoulder W External Rotation on [employer] - 10 reps - 1 sets - 1x daily - 7x weekly Seated Upper Extremity Diagonals with Resistance on Swiss Ball - 10 reps - 1 sets - 1x daily - 7x weekly Supine Hip and Knee Flexion AROM with Swiss Ball - 10 reps - 1 sets - 1x daily - 7x weekly Supine Gluteus and Hamstring Sets on Swiss Ball - 10 reps - 1 sets - 5 sec hold - 1x daily - 7x weekly Supine Lower Trunk Rotation with Swiss Ball - 10 reps - 1 sets - 1x daily - 7x weekly [facility] [address], [address] [place], [place] [address] Phone # [phone] Fax [phone]

## 2020-01-11 ENCOUNTER — Ambulatory Visit: Payer: No Typology Code available for payment source | Attending: Internal Medicine | Admitting: Physical Therapy

## 2020-01-11 ENCOUNTER — Encounter: Payer: Self-pay | Admitting: Physical Therapy

## 2020-01-11 ENCOUNTER — Other Ambulatory Visit: Payer: Self-pay

## 2020-01-11 DIAGNOSIS — R278 Other lack of coordination: Secondary | ICD-10-CM | POA: Insufficient documentation

## 2020-01-11 DIAGNOSIS — N3941 Urge incontinence: Secondary | ICD-10-CM | POA: Insufficient documentation

## 2020-01-11 DIAGNOSIS — M6281 Muscle weakness (generalized): Secondary | ICD-10-CM | POA: Diagnosis present

## 2020-01-11 NOTE — Patient Instructions (Signed)
Access Code: [email or web address]  URL: [email or web address]  Date: 01/11/2020  Prepared by: Eulis Foster   Exercises Seated Swiss Ball Pelvic Circles - 10 reps - 1 sets - 1x daily - 7x weekly Seated Lateral Pelvic Tilt on Swiss Ball - 10 reps - 1 sets - 1x daily                            - 7x weekly Alternating Shoulder Flexion Seated on Swiss Ball - 10 reps - 1 sets - 1x daily - 7x weekly Seated Pelvic Floor Contraction on Swiss Ball - 10 reps - 1 sets - 5 sec hold - 1x daily - 7x weekly Seated Shoulder W with Scaption on Swiss Ball - 10 reps - 1 sets - 1x daily - 7x weekly Seated Lean Back on Swiss Ball - 10 reps - 1 sets - 1x daily - 7x weekly Seated Shoulder W External Rotation on [employer] - 10 reps - 1 sets - 1x daily - 7x weekly Seated Upper Extremity Diagonals with Resistance on Swiss Ball - 10 reps - 1 sets - 1x daily - 7x weekly Supine Hip and Knee Flexion AROM with Swiss Ball - 10 reps - 1 sets - 1x daily - 7x weekly Supine Gluteus and Hamstring Sets on Swiss Ball - 10 reps - 1 sets - 5 sec hold - 1x daily - 7x weekly Supine Lower Trunk Rotation with Swiss Ball - 10 reps - 1 sets - 1x daily - 7x weekly Dead Bug - 10 reps - 2 sets - 1x daily - 7x weekly Hooklying Isometric Hip Flexion - 10 reps - 2 sets - 5 sec hold - 1x daily - 7x weekly Supine Bridge with Pelvic Floor Contraction - 15 reps - 1 sets - 1x daily - 7x weekly Seated Pelvic Floor Contraction - 5 reps - 1 sets - 15 sec hold - 2x daily - 7x weekly Seated Pelvic Floor Contraction with Hip Abduction and Resistance Loop - 15 reps - 1 sets - 1x daily - 7x weekly [facility] [address] [place], [place] [address] Phone # [phone] Fax [phone]

## 2020-01-11 NOTE — Therapy (Addendum)
[facility] [address], [place], [place], [address] Phone: [phone]   Fax:  [phone]  Physical Therapy Treatment  Patient Details  Name: Selena Long MRN: [id number] Date of Birth: 06-Apr-1956 Referring Provider (PT): Dr. Nicky Pugh   Encounter Date: 01/11/2020  PT End of Session - 01/11/20 1156    Visit Number  12    Date for PT Re-Evaluation  01/25/20    Authorization Type  [place]    Authorization Time Period  09/15/19-04/15/2020    Authorization - Visit Number  12    Authorization - Number of Visits  15    PT Start Time  5537   came late   PT Stop Time  1225    PT Time Calculation (min)  33 min    Activity Tolerance  Patient tolerated treatment well    Behavior During Therapy  [facility] for tasks assessed/performed       Past Medical History:  Diagnosis Date  . Acid reflux   . Diabetes mellitus without complication ([place])   . High cholesterol   . Hyperthyroidism   . Neuropathy   . Pancreatitis     Past Surgical History:  Procedure Laterality Date  . ABDOMINAL HYSTERECTOMY    . ABDOMINAL SURGERY    . TONSILLECTOMY      There were no vitals filed for this visit.  Subjective Assessment - 01/11/20 1155    Subjective  I feel okay about today being the last day.    Patient Stated Goals  reduce urinary leakage    Currently in Pain?  No/denies         [facility] PT Assessment - 01/11/20 0001      Assessment   Medical Diagnosis  [id number] Urge incontinence    Referring Provider (PT)  Dr. Nicky Pugh    Prior Therapy  none      Precautions   Precautions  None      Restrictions   Weight Bearing Restrictions  No      Home Environment   Living Environment  Private residence      Prior Function   Level of Independence  Independent      Cognition   Overall Cognitive Status  Within Functional Limits for tasks assessed      Posture/Postural Control   Posture/Postural Control  No significant  limitations      ROM / Strength   AROM / PROM / Strength  AROM;PROM;Strength      Strength   Right Hip Extension  5/5    Right Hip ABduction  4+/5    Right Hip ADduction  5/5    Left Hip Extension  5/5    Left Hip ABduction  5/5    Left Hip ADduction  5/5                Pelvic Floor Special Questions - 01/11/20 0001    Pad use  sized down to pantyliner compared to the heavy leakage pads    Activities that cause leaking  Other   when drink bladder irritants   Pelvic Floor Internal Exam  Patient confirms identification and approves PT to assess pelvic floor and treatment    Exam Type  Vaginal    Strength  fair squeeze, definite lift        [facility] Adult PT Treatment/Exercise - 01/11/20 0001      Lumbar Exercises: Aerobic   Recumbent Bike  level 2 for 6 minutes while assessing the  patient      Lumbar Exercises: Seated   Other Seated Lumbar Exercises  seated pelvic floor contraction holding 15 seconds 5 times; seated clam with red band withpelvic floor contraction 20x      Lumbar Exercises: Supine   Dead Bug  20 reps;1 second    Dead Bug Limitations  with pelvic floor contraction    Isometric Hip Flexion  20 reps;5 seconds      Knee/Hip Exercises: Supine   Bridges  Strengthening;1 set;15 reps    Bridges Limitations  with pelvic floor contraction    Other Supine Knee/Hip Exercises  hip flexion isometrics hold 5 sec 10x each side   with pelvic floor contraction            PT Education - 01/11/20 1223    Education Details  Access Code: [email or web address]    Person(s) Educated  Patient    Methods  Explanation;Demonstration;Verbal cues;Handout    Comprehension  Verbalized understanding;Returned demonstration       PT Short Term Goals - 10/27/19 1055      PT SHORT TERM GOAL #3   Title  understand how to manage vaginal dryness with coconut oil    Time  4    Period  Weeks    Status  Achieved    Target Date  10/27/19        PT Long Term Goals - 01/11/20 1226       PT LONG TERM GOAL #1   Title  independent with advanced HEP    Time  12    Period  Weeks    Status  Achieved      PT LONG TERM GOAL #2   Title  understand how to perform the urge to void so she is able to make it to the commode in time    Time  12    Period  Weeks    Status  Achieved      PT LONG TERM GOAL #3   Title  pelvic floor strength >/= 3/5 holding for 10 seconds so her urinary leakage is >/= 75% better    Time  12    Period  Weeks    Status  Achieved      PT LONG TERM GOAL #4   Title  wears 1 light day pad during the day for just in case due to reduction of urinary leakage    Time  12    Period  Weeks    Status  Achieved            Plan - 01/11/20 1216    Clinical Impression Statement  Patient has met all of her goals. Patient is independent with her HEP. Patient will leak urine when she drinkd bladder irritants. Patient will use a light day pad for just in case. Patient started with a heavy pad. Patient understands vaginal care. Pelvic floor strength is 3/5 holding for 15 seconds. Patient is ready for discharge.    Personal Factors and Comorbidities  Age;Fitness;Comorbidity 1    Comorbidities  neuropathy of left foot    Examination-Activity Limitations  Locomotion Level;Continence;Toileting    Stability/Clinical Decision Making  Evolving/Moderate complexity    Rehab Potential  Good    PT Treatment/Interventions  Biofeedback;Therapeutic activities;Therapeutic exercise;Patient/family education;Neuromuscular re-education;Manual techniques    PT Next Visit Plan  Discharge to HEP    PT Home Exercise Plan  Access Code: [email or web address]    Consulted and Agree with Plan of Care  Patient  Patient will benefit from skilled therapeutic intervention in order to improve the following deficits and impairments:  Decreased activity tolerance, Decreased strength, Decreased coordination  Visit Diagnosis: Muscle weakness (generalized)  Other lack of coordination  Urge  incontinence     Problem List There are no problems to display for this patient.   Earlie Counts, PT 01/11/20 12:30 PM   [facility] [address] [place], [place], [address] Phone: [phone]   Fax:  [phone]  Name: Remie Mathison MRN: [id number] Date of Birth: [birth year]  PHYSICAL THERAPY DISCHARGE SUMMARY  Visits from Start of Care: 12  Current functional level related to goals / functional outcomes: See above.   Remaining deficits: See above.    Education / Equipment: HEP Plan: Patient agrees to discharge.  Patient goals were met. Patient is being discharged due to meeting the stated rehab goals.  Thank you for the referral. Earlie Counts, PT 01/11/20 12:31 PM  ?????

## 2020-01-18 ENCOUNTER — Encounter: Payer: Non-veteran care | Admitting: Physical Therapy

## 2020-01-25 ENCOUNTER — Encounter: Payer: Non-veteran care | Admitting: Physical Therapy

## 2020-03-12 ENCOUNTER — Other Ambulatory Visit: Payer: Self-pay

## 2020-03-12 ENCOUNTER — Encounter (HOSPITAL_BASED_OUTPATIENT_CLINIC_OR_DEPARTMENT_OTHER): Payer: Self-pay | Admitting: *Deleted

## 2020-03-12 ENCOUNTER — Emergency Department (HOSPITAL_BASED_OUTPATIENT_CLINIC_OR_DEPARTMENT_OTHER)
Admission: EM | Admit: 2020-03-12 | Discharge: 2020-03-12 | Disposition: A | Discharge status: Home / Self Care | Payer: No Typology Code available for payment source | Attending: Emergency Medicine | Admitting: Emergency Medicine

## 2020-03-12 DIAGNOSIS — R112 Nausea with vomiting, unspecified: Secondary | ICD-10-CM | POA: Diagnosis not present

## 2020-03-12 DIAGNOSIS — R1110 Vomiting, unspecified: Secondary | ICD-10-CM | POA: Diagnosis present

## 2020-03-12 DIAGNOSIS — E119 Type 2 diabetes mellitus without complications: Secondary | ICD-10-CM | POA: Insufficient documentation

## 2020-03-12 DIAGNOSIS — Z79899 Other long term (current) drug therapy: Secondary | ICD-10-CM | POA: Insufficient documentation

## 2020-03-12 LAB — CBC WITH DIFFERENTIAL/PLATELET
Abs Immature Granulocytes: 0.02 10*3/uL (ref 0.00–0.07)
Basophils Absolute: 0 10*3/uL (ref 0.0–0.1)
Basophils Relative: 0 %
Eosinophils Absolute: 0 10*3/uL (ref 0.0–0.5)
Eosinophils Relative: 0 %
HCT: 41.1 % (ref 36.0–46.0)
Hemoglobin: 14.4 g/dL (ref 12.0–15.0)
Immature Granulocytes: 0 %
Lymphocytes Relative: 11 %
Lymphs Abs: 1.1 10*3/uL (ref 0.7–4.0)
MCH: 32.2 pg (ref 26.0–34.0)
MCHC: 35 g/dL (ref 30.0–36.0)
MCV: 91.9 fL (ref 80.0–100.0)
Monocytes Absolute: 0.3 10*3/uL (ref 0.1–1.0)
Monocytes Relative: 3 %
Neutro Abs: 8.1 10*3/uL — ABNORMAL HIGH (ref 1.7–7.7)
Neutrophils Relative %: 86 %
Platelets: 232 10*3/uL (ref 150–400)
RBC: 4.47 MIL/uL (ref 3.87–5.11)
RDW: 13.1 % (ref 11.5–15.5)
WBC: 9.4 10*3/uL (ref 4.0–10.5)
nRBC: 0 % (ref 0.0–0.2)

## 2020-03-12 LAB — COMPREHENSIVE METABOLIC PANEL
ALT: 17 U/L (ref 0–44)
AST: 21 U/L (ref 15–41)
Albumin: 4.5 g/dL (ref 3.5–5.0)
Alkaline Phosphatase: 94 U/L (ref 38–126)
Anion gap: 11 (ref 5–15)
BUN: 10 mg/dL (ref 8–23)
CO2: 25 mmol/L (ref 22–32)
Calcium: 9.5 mg/dL (ref 8.9–10.3)
Chloride: 101 mmol/L (ref 98–111)
Creatinine, Ser: 0.72 mg/dL (ref 0.44–1.00)
GFR calc Af Amer: >60 mL/min (ref >60)
GFR calc non Af Amer: >60 mL/min (ref >60)
Glucose, Bld: 178 mg/dL — ABNORMAL HIGH (ref 70–99)
Potassium: 3.5 mmol/L (ref 3.5–5.1)
Sodium: 137 mmol/L (ref 135–145)
Total Bilirubin: 0.6 mg/dL (ref 0.3–1.2)
Total Protein: 8.4 g/dL — ABNORMAL HIGH (ref 6.5–8.1)

## 2020-03-12 LAB — LIPASE, BLOOD: Lipase: 78 U/L — ABNORMAL HIGH (ref 11–51)

## 2020-03-12 MED ORDER — SODIUM CHLORIDE 0.9 % IV BOLUS
1000.0000 mL | Freq: Once | INTRAVENOUS | Status: AC
  Administered 2020-03-12: 10:00:00 1000 mL via INTRAVENOUS

## 2020-03-12 MED ORDER — ONDANSETRON HCL 4 MG PO TABS: 4.0000 mg | ORAL_TABLET | Freq: Three times a day (TID) | ORAL | 0 refills | Status: AC | PRN

## 2020-03-12 MED ORDER — ONDANSETRON HCL 4 MG/2ML IJ SOLN
4.0000 mg | Freq: Once | INTRAMUSCULAR | Status: AC
  Administered 2020-03-12: 10:00:00 4 mg via INTRAVENOUS
  Filled 2020-03-12: qty 2

## 2020-03-12 MED ORDER — MORPHINE SULFATE (PF) 4 MG/ML IV SOLN
4.0000 mg | Freq: Once | INTRAVENOUS | Status: AC
  Administered 2020-03-12: 4 mg via INTRAVENOUS
  Filled 2020-03-12: qty 1

## 2020-03-12 NOTE — ED Provider Notes (Signed)
[facility] EMERGENCY DEPARTMENT Provider Note   CSN: [id number] Arrival date & time: 03/12/20  4656     History Chief Complaint  Patient presents with  . Emesis    Selena Long is a 64 y.o. female.  64 year old female with history of diabetes and pancreatitis complaining of subxiphoid abdominal pain and multiple episodes of vomiting since yesterday.  No known fevers chills cough.  No diarrhea or urinary symptoms.  She says she has had this before.  Admits to marijuana.  The history is provided by the patient.  Emesis Severity:  Moderate Duration:  15 hours Timing:  Intermittent Number of daily episodes:  Too many to count Quality:  Stomach contents Progression:  Unchanged Chronicity:  Recurrent Recent urination:  Decreased Relieved by:  None tried Worsened by:  Nothing Ineffective treatments:  None tried Associated symptoms: abdominal pain   Associated symptoms: no cough, no diarrhea, no fever, no headaches and no sore throat   Risk factors: diabetes   Risk factors: no travel to endemic areas        Past Medical History:  Diagnosis Date  . Acid reflux   . Diabetes mellitus without complication ([facility])   . High cholesterol   . Hyperthyroidism   . Neuropathy   . Pancreatitis     There are no problems to display for this patient.   Past Surgical History:  Procedure Laterality Date  . ABDOMINAL HYSTERECTOMY    . ABDOMINAL SURGERY    . TONSILLECTOMY       OB History   No obstetric history on file.     History reviewed. No pertinent family history.  Social History   Tobacco Use  . Smoking status: Never Smoker  . Smokeless tobacco: Never Used  Substance Use Topics  . Alcohol use: Yes    Comment: occ  . Drug use: No    Home Medications Prior to Admission medications   Medication Sig Start Date End Date Taking? Authorizing Provider  benzonatate (TESSALON) 100 MG capsule Take 1 capsule (100 mg total) by mouth every 8 (eight) hours. Patient  not taking: Reported on 09/29/2019 12/20/16   Rolland Porter, MD  Calcium Carbonate-Vitamin D (CALCIUM 600+D HIGH POTENCY) 600-400 MG-UNIT tablet Take 1 tablet by mouth daily.    [provider]  cholecalciferol (VITAMIN D) 1000 units tablet Take 1,000 Units by mouth daily.    [provider]  cyclobenzaprine (FLEXERIL) 10 MG tablet Take 10 mg by mouth 3 (three) times daily as needed for muscle spasms.    [provider]  docusate sodium (COLACE) 50 MG capsule Take 50 mg by mouth 2 (two) times daily.    [provider]  famotidine (PEPCID) 20 MG tablet Take 1 tablet (20 mg total) by mouth 2 (two) times daily. Patient not taking: Reported on 09/29/2019 06/06/16   Jacalyn Lefevre, MD  gabapentin (NEURONTIN) 800 MG tablet Take 800 mg by mouth 3 (three) times daily.    [provider]  HYDROcodone-acetaminophen (NORCO/VICODIN) 5-325 MG tablet Take 1-2 tablets by mouth every 6 (six) hours as needed. Patient not taking: Reported on 09/29/2019 05/20/16   Ward, Layla Maw, DO  loratadine (CLARITIN) 10 MG tablet Take 10 mg by mouth daily.    [provider]  methimazole (TAPAZOLE) 5 MG tablet Take 5 mg by mouth daily.    [provider]  nortriptyline (PAMELOR) 10 MG capsule Take 10 mg by mouth at bedtime.    [provider]  oseltamivir (TAMIFLU)  75 MG capsule Take 1 capsule (75 mg total) by mouth every 12 (twelve) hours. Patient not taking: Reported on 09/29/2019 12/20/16   Tanna Furry, MD  oxybutynin (DITROPAN-XL) 10 MG 24 hr tablet Take 10 mg by mouth at bedtime.    [provider]  predniSONE (DELTASONE) 20 MG tablet Take 1 tablet (20 mg total) by mouth 2 (two) times daily with a meal. Patient not taking: Reported on 09/29/2019 12/20/16   Tanna Furry, MD  promethazine (PHENERGAN) 25 MG tablet Take 1 tablet (25 mg total) by mouth every 6 (six) hours as needed for nausea or vomiting. 06/06/16   Isla Pence, MD  simvastatin  (ZOCOR) 20 MG tablet Take 20 mg by mouth at bedtime.    [provider]    Allergies    Codeine and Penicillins  Review of Systems   Review of Systems  Constitutional: Positive for fatigue. Negative for fever.  HENT: Negative for sore throat.   Eyes: Negative for visual disturbance.  Respiratory: Negative for cough and shortness of breath.   Cardiovascular: Negative for chest pain.  Gastrointestinal: Positive for abdominal pain and vomiting. Negative for diarrhea.  Genitourinary: Negative for dysuria.  Musculoskeletal: Negative for neck pain.  Skin: Negative for rash.  Neurological: Negative for headaches.    Physical Exam Updated Vital Signs BP (!) 182/79 (BP Location: Right Arm)   Pulse 76   Temp 98.7 F (37.1 C) (Oral)   Resp (!) 22   Ht 5\' 1"  (1.549 m)   Wt 94.3 kg   SpO2 100%   BMI 39.30 kg/m   Physical Exam Vitals and nursing note reviewed.  Constitutional:      General: She is not in acute distress.    Appearance: She is well-developed.  HENT:     Head: Normocephalic and atraumatic.  Eyes:     Conjunctiva/sclera: Conjunctivae normal.  Cardiovascular:     Rate and Rhythm: Normal rate and regular rhythm.     Heart sounds: No murmur.  Pulmonary:     Effort: Pulmonary effort is normal. No respiratory distress.     Breath sounds: Normal breath sounds.  Abdominal:     Palpations: Abdomen is soft.     Tenderness: There is abdominal tenderness (subxiphoid). There is no guarding or rebound.  Musculoskeletal:        General: No deformity or signs of injury. Normal range of motion.     Cervical back: Neck supple.  Skin:    General: Skin is warm and dry.     Capillary Refill: Capillary refill takes less than 2 seconds.  Neurological:     General: No focal deficit present.     Mental Status: She is alert.     ED Results / Procedures / Treatments   Labs (all labs ordered are listed, but only abnormal results are displayed) Labs Reviewed   COMPREHENSIVE METABOLIC PANEL - Abnormal; Notable for the following components:      Result Value   Glucose, Bld 178 (*)    Total Protein 8.4 (*)    All other components within normal limits  LIPASE, BLOOD - Abnormal; Notable for the following components:   Lipase 78 (*)    All other components within normal limits  CBC WITH DIFFERENTIAL/PLATELET - Abnormal; Notable for the following components:   Neutro Abs 8.1 (*)    All other components within normal limits    EKG EKG Interpretation  Date/Time:  Monday Mar 12 2020 09:57:36 EDT Ventricular Rate:  75  PR Interval:    QRS Duration: 107 QT Interval:  427 QTC Calculation: 477 R Axis:   16 Text Interpretation: Sinus rhythm Borderline prolonged PR interval Abnormal R-wave progression, early transition Borderline T abnormalities, anterior leads Baseline wander in lead(s) I V1 No significant change since prior 12/16 Confirmed by Meridee Score [id number]) on 03/12/2020 10:03:15 AM   Radiology No results found.  Procedures Procedures (including critical care time)  Medications Ordered in ED Medications  sodium chloride 0.9 % bolus 1,000 mL (0 mLs Intravenous Stopped 03/12/20 1059)  morphine 4 MG/ML injection 4 mg (4 mg Intravenous Given 03/12/20 0956)  ondansetron (ZOFRAN) injection 4 mg (4 mg Intravenous Given 03/12/20 0955)    ED Course  I have reviewed the triage vital signs and the nursing notes.  Pertinent labs & imaging results that were available during my care of the patient were reviewed by me and considered in my medical decision making (see chart for details).  Clinical Course as of Mar 12 1641  Mon Mar 12, 2020  1052 Reevaluated patient, she is sleeping comfortably.  We will give her a little more time and then p.o. challenge.   [MB]  1127 Patient up and ambulatory to the bathroom.  Looks somewhat improved.   [MB]  1219 Tolerating ginger ale here.   [MB]    Clinical Course User Index [MB] Terrilee Files, MD   MDM  Rules/Calculators/A&P                     This patient complains of vomiting upper abdominal pain generalized weakness; this involves an extensive number of treatment Options and is a complaint that carries with it a high risk of complications and Morbidity. The differential includes metabolic derangement, gastroenteritis, cannabis hyperemesis syndrome, obstruction, peptic ulcer disease, dehydration  I ordered, reviewed and interpreted labs, which included CBC showing normal white count normal hemoglobin.  Chemistries normal other than a mildly elevated glucose of 178.  Normal LFTs.  Lipase elevated at 78 nonspecific I ordered medication IV fluids morphine Zofran. Previous records obtained and reviewed in epic  After the interventions stated above, I reevaluated the patient and found patient to be clinically improved.  She is tolerating fluids here.  We discussed return instructions.   Final Clinical Impression(s) / ED Diagnoses Final diagnoses:  Non-intractable vomiting with nausea, unspecified vomiting type    Rx / DC Orders ED Discharge Orders         Ordered    ondansetron (ZOFRAN) 4 MG tablet  Every 8 hours PRN     03/12/20 1222           Terrilee Files, MD 03/12/20 1643

## 2020-03-12 NOTE — Discharge Instructions (Addendum)
You were seen in the emergency department for nausea and vomiting.  Your blood work did not show any serious abnormalities.  You received some IV fluids and some nausea medication here with improvement in your symptoms.  We are prescribing you some nausea medication to use at home if needed. please continue to push fluids.  Follow-up with your doctor or return to the emergency department if any worsening or concerning symptoms

## 2020-03-12 NOTE — ED Triage Notes (Signed)
States began having N/V last PM, actively vomiting at this time

## 2020-03-12 NOTE — ED Notes (Signed)
Pt on monitor 

## 2020-03-24 ENCOUNTER — Other Ambulatory Visit: Payer: Self-pay

## 2020-03-24 ENCOUNTER — Emergency Department (HOSPITAL_BASED_OUTPATIENT_CLINIC_OR_DEPARTMENT_OTHER): Payer: No Typology Code available for payment source

## 2020-03-24 ENCOUNTER — Emergency Department (HOSPITAL_BASED_OUTPATIENT_CLINIC_OR_DEPARTMENT_OTHER)
Admission: EM | Admit: 2020-03-24 | Discharge: 2020-03-24 | Disposition: A | Discharge status: Home / Self Care | Payer: No Typology Code available for payment source | Attending: Emergency Medicine | Admitting: Emergency Medicine

## 2020-03-24 ENCOUNTER — Encounter (HOSPITAL_BASED_OUTPATIENT_CLINIC_OR_DEPARTMENT_OTHER): Payer: Self-pay | Admitting: Emergency Medicine

## 2020-03-24 DIAGNOSIS — R112 Nausea with vomiting, unspecified: Secondary | ICD-10-CM | POA: Diagnosis present

## 2020-03-24 DIAGNOSIS — E039 Hypothyroidism, unspecified: Secondary | ICD-10-CM | POA: Insufficient documentation

## 2020-03-24 DIAGNOSIS — E782 Mixed hyperlipidemia: Secondary | ICD-10-CM | POA: Insufficient documentation

## 2020-03-24 DIAGNOSIS — Z885 Allergy status to narcotic agent status: Secondary | ICD-10-CM | POA: Insufficient documentation

## 2020-03-24 DIAGNOSIS — Z79899 Other long term (current) drug therapy: Secondary | ICD-10-CM | POA: Diagnosis not present

## 2020-03-24 DIAGNOSIS — Z7984 Long term (current) use of oral hypoglycemic drugs: Secondary | ICD-10-CM | POA: Diagnosis not present

## 2020-03-24 DIAGNOSIS — E119 Type 2 diabetes mellitus without complications: Secondary | ICD-10-CM | POA: Insufficient documentation

## 2020-03-24 DIAGNOSIS — Z880 Allergy status to penicillin: Secondary | ICD-10-CM | POA: Diagnosis not present

## 2020-03-24 DIAGNOSIS — K529 Noninfective gastroenteritis and colitis, unspecified: Secondary | ICD-10-CM | POA: Insufficient documentation

## 2020-03-24 LAB — CBC WITH DIFFERENTIAL/PLATELET
Abs Immature Granulocytes: 0.01 10*3/uL (ref 0.00–0.07)
Basophils Absolute: 0 10*3/uL (ref 0.0–0.1)
Basophils Relative: 0 %
Eosinophils Absolute: 0 10*3/uL (ref 0.0–0.5)
Eosinophils Relative: 0 %
HCT: 46.9 % — ABNORMAL HIGH (ref 36.0–46.0)
Hemoglobin: 16.5 g/dL — ABNORMAL HIGH (ref 12.0–15.0)
Immature Granulocytes: 0 %
Lymphocytes Relative: 20 %
Lymphs Abs: 1 10*3/uL (ref 0.7–4.0)
MCH: 31.9 pg (ref 26.0–34.0)
MCHC: 35.2 g/dL (ref 30.0–36.0)
MCV: 90.5 fL (ref 80.0–100.0)
Monocytes Absolute: 0.4 10*3/uL (ref 0.1–1.0)
Monocytes Relative: 8 %
Neutro Abs: 3.4 10*3/uL (ref 1.7–7.7)
Neutrophils Relative %: 72 %
Platelets: 185 10*3/uL (ref 150–400)
RBC: 5.18 MIL/uL — ABNORMAL HIGH (ref 3.87–5.11)
RDW: 12.8 % (ref 11.5–15.5)
WBC: 4.7 10*3/uL (ref 4.0–10.5)
nRBC: 0 % (ref 0.0–0.2)

## 2020-03-24 LAB — URINALYSIS, ROUTINE W REFLEX MICROSCOPIC
Glucose, UA: >=500 mg/dL — AB
Hgb urine dipstick: NEGATIVE
Ketones, ur: 15 mg/dL — AB
Leukocytes,Ua: NEGATIVE
Nitrite: NEGATIVE
Protein, ur: 100 mg/dL — AB
Specific Gravity, Urine: >1.03 — ABNORMAL HIGH (ref 1.005–1.030)
pH: 6 (ref 5.0–8.0)

## 2020-03-24 LAB — URINALYSIS, MICROSCOPIC (REFLEX)

## 2020-03-24 LAB — COMPREHENSIVE METABOLIC PANEL
ALT: 29 U/L (ref 0–44)
AST: 44 U/L — ABNORMAL HIGH (ref 15–41)
Albumin: 4.2 g/dL (ref 3.5–5.0)
Alkaline Phosphatase: 80 U/L (ref 38–126)
Anion gap: 15 (ref 5–15)
BUN: 24 mg/dL — ABNORMAL HIGH (ref 8–23)
CO2: 23 mmol/L (ref 22–32)
Calcium: 9.2 mg/dL (ref 8.9–10.3)
Chloride: 96 mmol/L — ABNORMAL LOW (ref 98–111)
Creatinine, Ser: 1.13 mg/dL — ABNORMAL HIGH (ref 0.44–1.00)
GFR calc Af Amer: 60 mL/min — ABNORMAL LOW (ref >60)
GFR calc non Af Amer: 52 mL/min — ABNORMAL LOW (ref >60)
Glucose, Bld: 204 mg/dL — ABNORMAL HIGH (ref 70–99)
Potassium: 3.7 mmol/L (ref 3.5–5.1)
Sodium: 134 mmol/L — ABNORMAL LOW (ref 135–145)
Total Bilirubin: 0.4 mg/dL (ref 0.3–1.2)
Total Protein: 8.2 g/dL — ABNORMAL HIGH (ref 6.5–8.1)

## 2020-03-24 LAB — LIPASE, BLOOD: Lipase: 47 U/L (ref 11–51)

## 2020-03-24 MED ORDER — IOHEXOL 300 MG/ML  SOLN
100.0000 mL | Freq: Once | INTRAMUSCULAR | Status: AC | PRN
  Administered 2020-03-24: 100 mL via INTRAVENOUS

## 2020-03-24 MED ORDER — ONDANSETRON 8 MG PO TBDP
ORAL_TABLET | ORAL | 0 refills | Status: DC
Start: 2020-03-24 — End: 2020-11-21

## 2020-03-24 MED ORDER — PROMETHAZINE HCL 25 MG/ML IJ SOLN
12.5000 mg | Freq: Once | INTRAMUSCULAR | Status: AC
  Administered 2020-03-24: 12.5 mg via INTRAVENOUS
  Filled 2020-03-24: qty 1

## 2020-03-24 MED ORDER — KETOROLAC TROMETHAMINE 30 MG/ML IJ SOLN
30.0000 mg | Freq: Once | INTRAMUSCULAR | Status: AC
  Administered 2020-03-24: 30 mg via INTRAVENOUS
  Filled 2020-03-24: qty 1

## 2020-03-24 MED ORDER — SODIUM CHLORIDE 0.9 % IV BOLUS
1000.0000 mL | Freq: Once | INTRAVENOUS | Status: AC
  Administered 2020-03-24: 1000 mL via INTRAVENOUS

## 2020-03-24 MED ORDER — ONDANSETRON HCL 4 MG/2ML IJ SOLN
4.0000 mg | Freq: Once | INTRAMUSCULAR | Status: AC
  Administered 2020-03-24: 4 mg via INTRAVENOUS
  Filled 2020-03-24: qty 2

## 2020-03-24 MED ORDER — MORPHINE SULFATE (PF) 4 MG/ML IV SOLN
4.0000 mg | Freq: Once | INTRAVENOUS | Status: AC
  Administered 2020-03-24: 4 mg via INTRAVENOUS
  Filled 2020-03-24: qty 1

## 2020-03-24 NOTE — ED Provider Notes (Signed)
[facility] EMERGENCY DEPARTMENT Provider Note   CSN: [id number] Arrival date & time: 03/24/20  9622     History Chief Complaint  Patient presents with  . Emesis    Selena Long is a 64 y.o. female.  Patient is a 64 year old female with past medical history of diabetes, acid reflux, pancreatitis, and gastroparesis.  She presents today for evaluation of generalized abdominal cramping, nausea, and vomiting.  This has been ongoing since yesterday.  Patient seen here approximately 10 days ago with similar complaints.  She was given IV fluids and medications and felt better.  Her symptoms returned several days later and worsened yesterday.  She denies any bloody stool or vomit.  She denies any fevers, chills, or sick contacts.  She denies any urinary complaints.  The history is provided by the patient.  Emesis Severity:  Moderate Duration:  2 days Timing:  Constant Progression:  Worsening Chronicity:  Recurrent Recent urination:  Normal Relieved by:  Nothing Worsened by:  Nothing Ineffective treatments:  None tried Associated symptoms: abdominal pain   Associated symptoms: no chills and no fever        Past Medical History:  Diagnosis Date  . Acid reflux   . Diabetes mellitus without complication ([place])   . High cholesterol   . Hyperthyroidism   . Neuropathy   . Pancreatitis     There are no problems to display for this patient.   Past Surgical History:  Procedure Laterality Date  . ABDOMINAL HYSTERECTOMY    . ABDOMINAL SURGERY    . TONSILLECTOMY       OB History   No obstetric history on file.     No family history on file.  Social History   Tobacco Use  . Smoking status: Never Smoker  . Smokeless tobacco: Never Used  Substance Use Topics  . Alcohol use: Yes    Comment: occ  . Drug use: Yes    Types: Marijuana    Home Medications Prior to Admission medications   Medication Sig Start Date End Date Taking? Authorizing Provider   benzonatate (TESSALON) 100 MG capsule Take 1 capsule (100 mg total) by mouth every 8 (eight) hours. Patient not taking: Reported on 09/29/2019 12/20/16   Tanna Furry, MD  Calcium Carbonate-Vitamin D (CALCIUM 600+D HIGH POTENCY) 600-400 MG-UNIT tablet Take 1 tablet by mouth daily.    [provider]  cholecalciferol (VITAMIN D) 1000 units tablet Take 1,000 Units by mouth daily.    [provider]  cyclobenzaprine (FLEXERIL) 10 MG tablet Take 10 mg by mouth 3 (three) times daily as needed for muscle spasms.    [provider]  docusate sodium (COLACE) 50 MG capsule Take 50 mg by mouth 2 (two) times daily.    [provider]  famotidine (PEPCID) 20 MG tablet Take 1 tablet (20 mg total) by mouth 2 (two) times daily. Patient not taking: Reported on 09/29/2019 06/06/16   Isla Pence, MD  gabapentin (NEURONTIN) 800 MG tablet Take 800 mg by mouth 3 (three) times daily.    [provider]  HYDROcodone-acetaminophen (NORCO/VICODIN) 5-325 MG tablet Take 1-2 tablets by mouth every 6 (six) hours as needed. Patient not taking: Reported on 09/29/2019 05/20/16   Ward, Delice Bison, DO  loratadine (CLARITIN) 10 MG tablet Take 10 mg by mouth daily.    [provider]  methimazole (TAPAZOLE) 5 MG tablet Take 5 mg by mouth daily.    [provider]  nortriptyline (PAMELOR) 10 MG capsule  Take 10 mg by mouth at bedtime.    [provider]  ondansetron (ZOFRAN) 4 MG tablet Take 1 tablet (4 mg total) by mouth every 8 (eight) hours as needed for nausea or vomiting. 03/12/20   Terrilee Files, MD  oseltamivir (TAMIFLU) 75 MG capsule Take 1 capsule (75 mg total) by mouth every 12 (twelve) hours. Patient not taking: Reported on 09/29/2019 12/20/16   Rolland Porter, MD  oxybutynin (DITROPAN-XL) 10 MG 24 hr tablet Take 10 mg by mouth at bedtime.    [provider]  predniSONE (DELTASONE) 20 MG tablet Take 1 tablet (20 mg total) by mouth 2 (two) times  daily with a meal. Patient not taking: Reported on 09/29/2019 12/20/16   Rolland Porter, MD  promethazine (PHENERGAN) 25 MG tablet Take 1 tablet (25 mg total) by mouth every 6 (six) hours as needed for nausea or vomiting. 06/06/16   Jacalyn Lefevre, MD  simvastatin (ZOCOR) 20 MG tablet Take 20 mg by mouth at bedtime.    [provider]    Allergies    Codeine and Penicillins  Review of Systems   Review of Systems  Constitutional: Negative for chills and fever.  Gastrointestinal: Positive for abdominal pain and vomiting.  All other systems reviewed and are negative.   Physical Exam Updated Vital Signs BP (!) 154/113 (BP Location: Right Arm)   Pulse 92   Temp 99.4 F (37.4 C) (Oral)   Resp 20   Ht 5\' 1"  (1.549 m)   Wt 89.4 kg   SpO2 100%   BMI 37.22 kg/m   Physical Exam Vitals and nursing note reviewed.  Constitutional:      General: She is not in acute distress.    Appearance: She is well-developed. She is not diaphoretic.  HENT:     Head: Normocephalic and atraumatic.  Cardiovascular:     Rate and Rhythm: Normal rate and regular rhythm.     Heart sounds: No murmur. No friction rub. No gallop.   Pulmonary:     Effort: Pulmonary effort is normal. No respiratory distress.     Breath sounds: Normal breath sounds. No wheezing.  Abdominal:     General: Bowel sounds are normal. There is no distension.     Palpations: Abdomen is soft.     Tenderness: There is no abdominal tenderness.  Musculoskeletal:        General: Normal range of motion.     Cervical back: Normal range of motion and neck supple.  Skin:    General: Skin is warm and dry.  Neurological:     Mental Status: She is alert and oriented to person, place, and time.     ED Results / Procedures / Treatments   Labs (all labs ordered are listed, but only abnormal results are displayed) Labs Reviewed  COMPREHENSIVE METABOLIC PANEL  CBC WITH DIFFERENTIAL/PLATELET  LIPASE, BLOOD  URINALYSIS, ROUTINE W  REFLEX MICROSCOPIC    EKG None  Radiology No results found.  Procedures Procedures (including critical care time)  Medications Ordered in ED Medications  ondansetron (ZOFRAN) injection 4 mg (has no administration in time range)  sodium chloride 0.9 % bolus 1,000 mL (has no administration in time range)  morphine 4 MG/ML injection 4 mg (has no administration in time range)    ED Course  I have reviewed the triage vital signs and the nursing notes.  Pertinent labs & imaging results that were available during my care of the patient were reviewed by me and  considered in my medical decision making (see chart for details).    MDM Rules/Calculators/A&P  Patient presenting here with nausea and vomiting and diarrhea for the past several days.  All has been nonbloody and nonmelanotic.  Patient complaining of generalized abdominal discomfort.  She was seen recently with similar complaints.  Today's work-up shows no fever, no elevation of white count, benign abdomen, and electrolytes that are not suggestive of significant dehydration.  She looks clinically well hydrated and there is no hypotension or tachycardia.  CT scan shows no acute process.  At this point, I suspect a viral etiology.  She is feeling somewhat better after receiving medications and fluids here in the ER.  Patient to be discharged with ODT Zofran and follow-up with her gastroenterologist.  Final Clinical Impression(s) / ED Diagnoses Final diagnoses:  None    Rx / DC Orders ED Discharge Orders    None       Geoffery Lyons, MD 03/24/20 1139

## 2020-03-24 NOTE — ED Notes (Signed)
Pt unable to provide urine sample at this time 

## 2020-03-24 NOTE — Discharge Instructions (Addendum)
Begin taking Zofran as prescribed.  Clear liquid diet for the next 12 hours, then advance to normal as tolerated.  Return to the emergency department if you develop a significant worsening of your symptoms, high fever, bloody stool or vomit, or other new and concerning symptoms.

## 2020-03-24 NOTE — ED Notes (Signed)
Patient transported to CT 

## 2020-03-24 NOTE — ED Triage Notes (Signed)
N/V/D since yesterday, h/a x 4 days

## 2020-11-21 ENCOUNTER — Encounter (HOSPITAL_BASED_OUTPATIENT_CLINIC_OR_DEPARTMENT_OTHER): Payer: Self-pay

## 2020-11-21 ENCOUNTER — Other Ambulatory Visit: Payer: Self-pay

## 2020-11-21 ENCOUNTER — Emergency Department (HOSPITAL_BASED_OUTPATIENT_CLINIC_OR_DEPARTMENT_OTHER)
Admission: EM | Admit: 2020-11-21 | Discharge: 2020-11-21 | Disposition: A | Discharge status: Home / Self Care | Payer: No Typology Code available for payment source | Attending: Emergency Medicine | Admitting: Emergency Medicine

## 2020-11-21 ENCOUNTER — Emergency Department (HOSPITAL_BASED_OUTPATIENT_CLINIC_OR_DEPARTMENT_OTHER): Payer: No Typology Code available for payment source

## 2020-11-21 DIAGNOSIS — U071 COVID-19: Secondary | ICD-10-CM | POA: Insufficient documentation

## 2020-11-21 DIAGNOSIS — R059 Cough, unspecified: Secondary | ICD-10-CM

## 2020-11-21 DIAGNOSIS — E119 Type 2 diabetes mellitus without complications: Secondary | ICD-10-CM | POA: Diagnosis not present

## 2020-11-21 DIAGNOSIS — Z20822 Contact with and (suspected) exposure to covid-19: Secondary | ICD-10-CM

## 2020-11-21 LAB — SARS CORONAVIRUS 2 (TAT 6-24 HRS): SARS Coronavirus 2: POSITIVE — AB

## 2020-11-21 MED ORDER — FLUTICASONE PROPIONATE 50 MCG/ACT NA SUSP
2.0000 | Freq: Every day | NASAL | 0 refills | Status: DC
Start: 2020-11-21 — End: 2024-03-14

## 2020-11-21 MED ORDER — ACETAMINOPHEN 325 MG PO TABS
650.0000 mg | ORAL_TABLET | Freq: Once | ORAL | Status: AC
  Administered 2020-11-21: 650 mg via ORAL
  Filled 2020-11-21: qty 2

## 2020-11-21 MED ORDER — PROMETHAZINE-DM 6.25-15 MG/5ML PO SYRP
5.0000 mL | ORAL_SOLUTION | Freq: Four times a day (QID) | ORAL | 0 refills | Status: DC | PRN
Start: 2020-11-21 — End: 2024-03-14

## 2020-11-21 MED ORDER — ONDANSETRON 4 MG PO TBDP: 4.0000 mg | ORAL_TABLET | Freq: Three times a day (TID) | ORAL | 0 refills | Status: AC | PRN

## 2020-11-21 MED ORDER — BENZONATATE 100 MG PO CAPS
100.0000 mg | ORAL_CAPSULE | Freq: Three times a day (TID) | ORAL | 0 refills | Status: DC
Start: 2020-11-21 — End: 2024-03-14

## 2020-11-21 NOTE — ED Provider Notes (Signed)
[facility] EMERGENCY DEPARTMENT Provider Note   CSN: [id number] Arrival date & time: 11/21/20  1111     History Chief Complaint  Patient presents with  . Cough    Selena Long is a 65 y.o. female.  HPI Patient is a 65 year old female with a past medical history significant for DM, HTN, HLD, neuropathy.  Patient is presenting today with cough for 3 days.  She states that she has had scant thin stringy blood when she coughs.  She denies any chest pain or shortness of breath.  She was concerned for pneumonia and presented to the ER.  She has not taken any Tylenol or ibuprofen for her symptoms.  She states she feels significantly better after the Tylenol that she received in the waiting room.  She states that she has been coughing intermittently for some time but that it is rare, dry, nonpainful until 3 days ago when he became much more frequent and associated with some myalgias, fatigue and malaise.    Hemoptysis is described as scant and thin strings with coughing.  Does not feel SOB or CP  No recent surgeries, hospitalization, long travel, hemoptysis, estrogen containing OCP, cancer history.  No unilateral leg swelling.  No history of PE or VTE.    Past Medical History:  Diagnosis Date  . Acid reflux   . Diabetes mellitus without complication (HCC)   . High cholesterol   . Hyperthyroidism   . Neuropathy   . Pancreatitis     There are no problems to display for this patient.   Past Surgical History:  Procedure Laterality Date  . ABDOMINAL HYSTERECTOMY    . ABDOMINAL SURGERY    . TONSILLECTOMY       OB History   No obstetric history on file.     No family history on file.  Social History   Tobacco Use  . Smoking status: Never Smoker  . Smokeless tobacco: Never Used  Substance Use Topics  . Alcohol use: Yes    Comment: occ  . Drug use: Yes    Types: Marijuana    Home Medications Prior to Admission medications   Medication Sig Start  Date End Date Taking? Authorizing Provider  benzonatate (TESSALON) 100 MG capsule Take 1 capsule (100 mg total) by mouth every 8 (eight) hours. 11/21/20  Yes Aprel Egelhoff S, PA  fluticasone (FLONASE) 50 MCG/ACT nasal spray Place 2 sprays into both nostrils daily for 14 days. 11/21/20 12/05/20 Yes Adda Stokes S, PA  ondansetron (ZOFRAN ODT) 4 MG disintegrating tablet Take 1 tablet (4 mg total) by mouth every 8 (eight) hours as needed for nausea or vomiting. 11/21/20  Yes Laurenashley Viar S, PA  promethazine-dextromethorphan (PROMETHAZINE-DM) 6.25-15 MG/5ML syrup Take 5 mLs by mouth 4 (four) times daily as needed for cough. 11/21/20  Yes Shakyra Mattera S, PA  Calcium Carbonate-Vitamin D (CALCIUM 600+D HIGH POTENCY) 600-400 MG-UNIT tablet Take 1 tablet by mouth daily.    [provider]  cholecalciferol (VITAMIN D) 1000 units tablet Take 1,000 Units by mouth daily.    [provider]  cyclobenzaprine (FLEXERIL) 10 MG tablet Take 10 mg by mouth 3 (three) times daily as needed for muscle spasms.    [provider]  docusate sodium (COLACE) 50 MG capsule Take 50 mg by mouth 2 (two) times daily.    [provider]  famotidine (PEPCID) 20 MG tablet Take 1 tablet (20 mg total) by mouth 2 (two) times daily. Patient not taking: Reported  on 09/29/2019 06/06/16   Jacalyn Lefevre, MD  gabapentin (NEURONTIN) 800 MG tablet Take 800 mg by mouth 3 (three) times daily.    [provider]  HYDROcodone-acetaminophen (NORCO/VICODIN) 5-325 MG tablet Take 1-2 tablets by mouth every 6 (six) hours as needed. Patient not taking: Reported on 09/29/2019 05/20/16   Ward, Layla Maw, DO  loratadine (CLARITIN) 10 MG tablet Take 10 mg by mouth daily.    [provider]  methimazole (TAPAZOLE) 5 MG tablet Take 5 mg by mouth daily.    [provider]  nortriptyline (PAMELOR) 10 MG capsule Take 10 mg by mouth at bedtime.    [provider]  ondansetron (ZOFRAN) 4 MG  tablet Take 1 tablet (4 mg total) by mouth every 8 (eight) hours as needed for nausea or vomiting. 03/12/20   Terrilee Files, MD  oseltamivir (TAMIFLU) 75 MG capsule Take 1 capsule (75 mg total) by mouth every 12 (twelve) hours. Patient not taking: Reported on 09/29/2019 12/20/16   Rolland Porter, MD  oxybutynin (DITROPAN-XL) 10 MG 24 hr tablet Take 10 mg by mouth at bedtime.    [provider]  predniSONE (DELTASONE) 20 MG tablet Take 1 tablet (20 mg total) by mouth 2 (two) times daily with a meal. Patient not taking: Reported on 09/29/2019 12/20/16   Rolland Porter, MD  promethazine (PHENERGAN) 25 MG tablet Take 1 tablet (25 mg total) by mouth every 6 (six) hours as needed for nausea or vomiting. 06/06/16   Jacalyn Lefevre, MD  simvastatin (ZOCOR) 20 MG tablet Take 20 mg by mouth at bedtime.    [provider]    Allergies    Codeine  Review of Systems   Review of Systems  Constitutional: Positive for chills, fatigue and fever.  HENT: Positive for congestion, postnasal drip, rhinorrhea and sore throat.   Eyes: Negative for redness.  Respiratory: Positive for cough. Negative for shortness of breath.        Hemoptysis  Cardiovascular: Negative for chest pain and leg swelling.  Gastrointestinal: Positive for diarrhea, nausea and vomiting. Negative for abdominal pain.  Endocrine: Negative for polyphagia.  Genitourinary: Negative for dysuria.  Musculoskeletal: Positive for myalgias.  Skin: Negative for rash.  Neurological: Positive for headaches. Negative for syncope.  Psychiatric/Behavioral: Negative for confusion.    Physical Exam Updated Vital Signs BP 132/79 (BP Location: Right Arm)   Pulse 75   Temp 98.8 F (37.1 C)   Resp 18   Ht 5\' 1"  (1.549 m)   Wt 89.8 kg   SpO2 99%   BMI 37.41 kg/m   Physical Exam Vitals and nursing note reviewed.  Constitutional:      General: She is not in acute distress.    Comments: Pleasant well-appearing 65 year old.  In no acute  distress.  Sitting comfortably in bed.  Able answer questions appropriately follow commands. No increased work of breathing. Speaking in full sentences.  HENT:     Head: Normocephalic and atraumatic.     Nose: Nose normal.  Eyes:     General: No scleral icterus. Cardiovascular:     Rate and Rhythm: Normal rate and regular rhythm.     Pulses: Normal pulses.     Heart sounds: Normal heart sounds.  Pulmonary:     Effort: Pulmonary effort is normal. No respiratory distress.     Breath sounds: No wheezing.     Comments: Coarse lung sounds.  Coughing occasionally. Abdominal:     Palpations: Abdomen is soft.  Tenderness: There is no abdominal tenderness.  Musculoskeletal:     Cervical back: Normal range of motion.     Right lower leg: No edema.     Left lower leg: No edema.     Comments: No calf tenderness  Skin:    General: Skin is warm and dry.     Capillary Refill: Capillary refill takes less than 2 seconds.  Neurological:     Mental Status: She is alert. Mental status is at baseline.  Psychiatric:        Mood and Affect: Mood normal.        Behavior: Behavior normal.     ED Results / Procedures / Treatments   Labs (all labs ordered are listed, but only abnormal results are displayed) Labs Reviewed  SARS CORONAVIRUS 2 (TAT 6-24 HRS) - Abnormal; Notable for the following components:      Result Value   SARS Coronavirus 2 POSITIVE (*)    All other components within normal limits    EKG None  Radiology DG Chest Port 1 View  Result Date: 11/21/2020 CLINICAL DATA:  Chest pain.  Cough. EXAM: PORTABLE CHEST 1 VIEW COMPARISON:  December 20, 2016. FINDINGS: The heart size and mediastinal contours are within normal limits. Both lungs are clear. No visible pleural effusions or pneumothorax. No acute osseous abnormality. IMPRESSION: No active disease. Electronically Signed   By: Feliberto Harts MD   On: 11/21/2020 15:10    Procedures Procedures (including critical care  time)  Medications Ordered in ED Medications  acetaminophen (TYLENOL) tablet 650 mg (650 mg Oral Given 11/21/20 1131)    ED Course  I have reviewed the triage vital signs and the nursing notes.  Pertinent labs & imaging results that were available during my care of the patient were reviewed by me and considered in my medical decision making (see chart for details).    MDM Rules/Calculators/A&P                          Patient is 65 year old female with no risk factors for PE (No recent surgeries, hospitalization, long travel, hemoptysis, estrogen containing OCP, cancer history.  No unilateral leg swelling.  No history of PE or VTE) presented today with cough congestion fevers chills chest x-ray remarkable.  Suspect this is bronchitis versus COVID infection.  She is not significantly short of breath she had mild tachycardia which resolved once her 1 and 1.4 fever was treated with Tylenol.  She denies any chest pain or shortness of breath.  A very low suspicion for PE.  Once afebrile patient does not have any tachycardia and I suspect this was due to her fever.  She is low risk by [place].  Clinically given my low suspicion for PE will not further work patient up for this.  I discussed case briefly finding physician prior to discharge.  I gave her strict return precautions.  At the time of this note completion patient's COVID test has returned positive.  Selena Long was evaluated in Emergency Department on 11/22/2020 for the symptoms described in the history of present illness. She was evaluated in the context of the global COVID-19 pandemic, which necessitated consideration that the patient might be at risk for infection with the SARS-CoV-2 virus that causes COVID-19. Institutional protocols and algorithms that pertain to the evaluation of patients at risk for COVID-19 are in a state of rapid change based on information released by regulatory bodies including the CDC  and federal and  state organizations. These policies and algorithms were followed during the patient's care in the ED.  Final Clinical Impression(s) / ED Diagnoses Final diagnoses:  Cough  Suspected COVID-19 virus infection    Rx / DC Orders ED Discharge Orders         Ordered    benzonatate (TESSALON) 100 MG capsule  Every 8 hours        11/21/20 1617    promethazine-dextromethorphan (PROMETHAZINE-DM) 6.25-15 MG/5ML syrup  4 times daily PRN        11/21/20 1617    fluticasone (FLONASE) 50 MCG/ACT nasal spray  Daily        11/21/20 1617    ondansetron (ZOFRAN ODT) 4 MG disintegrating tablet  Every 8 hours PRN        11/21/20 1617           Gailen Shelter, [place] 11/22/20 2021    Tilden Fossa, MD 11/26/20 1326

## 2020-11-21 NOTE — ED Notes (Signed)
Pt. Vitals Cycling

## 2020-11-21 NOTE — Discharge Instructions (Addendum)
Your COVID test is pending; you should expect results within 24 hours. You can access your results on your MyChart--if you test positive you should receive a phone call.  In the meantime follow CDC guidelines and quarantine, wear a mask, wash hands often.   Please use Tylenol or ibuprofen for pain.  You may use 600 mg ibuprofen every 6 hours or 1000 mg of Tylenol every 6 hours.  You may choose to alternate between the 2.  This would be most effective.  Not to exceed 4 g of Tylenol within 24 hours.  Not to exceed 3200 mg ibuprofen 24 hours.   Please take over the counter vitamin D 2000-4000 units per day. I also recommend zinc 50 mg per day for the next two weeks.   Please return to ED if you feel have difficulty breathing or have emergent, new or concerning symptoms.  Patients who have symptoms consistent with COVID-19 should self isolated for: At least 3 days (72 hours) have passed since recovery, defined as resolution of fever without the use of fever reducing medications and improvement in respiratory symptoms (e.g., cough, shortness of breath), and At least 7 days have passed since symptoms first appeared.       Person Under Monitoring Name: Selena Long  Location: [address] [place] [address]   Infection Prevention Recommendations for Individuals Confirmed to have, or Being Evaluated for, 2019 Novel Coronavirus (COVID-19) Infection Who Receive Care at Home  Individuals who are confirmed to have, or are being evaluated for, COVID-19 should follow the prevention steps below until a healthcare provider or local or state health department says they can return to normal activities.  Stay home except to get medical care You should restrict activities outside your home, except for getting medical care. Do not go to work, school, or public areas, and do not use public transportation or taxis.  Call ahead before visiting your doctor Before your medical  appointment, call the healthcare provider and tell them that you have, or are being evaluated for, COVID-19 infection. This will help the healthcare providers office take steps to keep other people from getting infected. Ask your healthcare provider to call the local or state health department.  Monitor your symptoms Seek prompt medical attention if your illness is worsening (e.g., difficulty breathing). Before going to your medical appointment, call the healthcare provider and tell them that you have, or are being evaluated for, COVID-19 infection. Ask your healthcare provider to call the local or state health department.  Wear a facemask You should wear a facemask that covers your nose and mouth when you are in the same room with other people and when you visit a healthcare provider. People who live with or visit you should also wear a facemask while they are in the same room with you.  Separate yourself from other people in your home As much as possible, you should stay in a different room from other people in your home. Also, you should use a separate bathroom, if available.  Avoid sharing household items You should not share dishes, drinking glasses, cups, eating utensils, towels, bedding, or other items with other people in your home. After using these items, you should wash them thoroughly with soap and water.  Cover your coughs and sneezes Cover your mouth and nose with a tissue when you cough or sneeze, or you can cough or sneeze into your sleeve. Throw used tissues in a lined trash can, and immediately wash your  hands with soap and water for at least 20 seconds or use an alcohol-based hand rub.  Wash your [employer] your hands often and thoroughly with soap and water for at least 20 seconds. You can use an alcohol-based hand sanitizer if soap and water are not available and if your hands are not visibly dirty. Avoid touching your eyes, nose, and mouth with unwashed  hands.   Prevention Steps for Caregivers and Household Members of Individuals Confirmed to have, or Being Evaluated for, COVID-19 Infection Being Cared for in the Home  If you live with, or provide care at home for, a person confirmed to have, or being evaluated for, COVID-19 infection please follow these guidelines to prevent infection:  Follow healthcare providers instructions Make sure that you understand and can help the patient follow any healthcare provider instructions for all care.  Provide for the patients basic needs You should help the patient with basic needs in the home and provide support for getting groceries, prescriptions, and other personal needs.  Monitor the patients symptoms If they are getting sicker, call his or her medical provider and tell them that the patient has, or is being evaluated for, COVID-19 infection. This will help the healthcare providers office take steps to keep other people from getting infected. Ask the healthcare provider to call the local or state health department.  Limit the number of people who have contact with the patient If possible, have only one caregiver for the patient. Other household members should stay in another home or place of residence. If this is not possible, they should stay in another room, or be separated from the patient as much as possible. Use a separate bathroom, if available. Restrict visitors who do not have an essential need to be in the home.  Keep older adults, very young children, and other sick people away from the patient Keep older adults, very young children, and those who have compromised immune systems or chronic health conditions away from the patient. This includes people with chronic heart, lung, or kidney conditions, diabetes, and cancer.  Ensure good ventilation Make sure that shared spaces in the home have good air flow, such as from an air conditioner or an opened window, weather  permitting.  Wash your hands often Wash your hands often and thoroughly with soap and water for at least 20 seconds. You can use an alcohol based hand sanitizer if soap and water are not available and if your hands are not visibly dirty. Avoid touching your eyes, nose, and mouth with unwashed hands. Use disposable paper towels to dry your hands. If not available, use dedicated cloth towels and replace them when they become wet.  Wear a facemask and gloves Wear a disposable facemask at all times in the room and gloves when you touch or have contact with the patients blood, body fluids, and/or secretions or excretions, such as sweat, saliva, sputum, nasal mucus, vomit, urine, or feces.  Ensure the mask fits over your nose and mouth tightly, and do not touch it during use. Throw out disposable facemasks and gloves after using them. Do not reuse. Wash your hands immediately after removing your facemask and gloves. If your personal clothing becomes contaminated, carefully remove clothing and launder. Wash your hands after handling contaminated clothing. Place all used disposable facemasks, gloves, and other waste in a lined container before disposing them with other household waste. Remove gloves and wash your hands immediately after handling these items.  Do not share dishes, glasses,  or other household items with the patient Avoid sharing household items. You should not share dishes, drinking glasses, cups, eating utensils, towels, bedding, or other items with a patient who is confirmed to have, or being evaluated for, COVID-19 infection. After the person uses these items, you should wash them thoroughly with soap and water.  Wash laundry thoroughly Immediately remove and wash clothes or bedding that have blood, body fluids, and/or secretions or excretions, such as sweat, saliva, sputum, nasal mucus, vomit, urine, or feces, on them. Wear gloves when handling laundry from the patient. Read and  follow directions on labels of laundry or clothing items and detergent. In general, wash and dry with the warmest temperatures recommended on the label.  Clean all areas the individual has used often Clean all touchable surfaces, such as counters, tabletops, doorknobs, bathroom fixtures, toilets, phones, keyboards, tablets, and bedside tables, every day. Also, clean any surfaces that may have blood, body fluids, and/or secretions or excretions on them. Wear gloves when cleaning surfaces the patient has come in contact with. Use a diluted bleach solution (e.g., dilute bleach with 1 part bleach and 10 parts water) or a household disinfectant with a label that says EPA-registered for coronaviruses. To make a bleach solution at home, add 1 tablespoon of bleach to 1 quart (4 cups) of water. For a larger supply, add  cup of bleach to 1 gallon (16 cups) of water. Read labels of cleaning products and follow recommendations provided on product labels. Labels contain instructions for safe and effective use of the cleaning product including precautions you should take when applying the product, such as wearing gloves or eye protection and making sure you have good ventilation during use of the product. Remove gloves and wash hands immediately after cleaning.  Monitor yourself for signs and symptoms of illness Caregivers and household members are considered close contacts, should monitor their health, and will be asked to limit movement outside of the home to the extent possible. Follow the monitoring steps for close contacts listed on the symptom monitoring form.   ? If you have additional questions, contact your local health department or call the epidemiologist on call at [phone] (available 24/7). ? This guidance is subject to change. For the most up-to-date guidance from [facility], please refer to their website: [email or web address]

## 2020-11-21 NOTE — ED Triage Notes (Signed)
Pt c/o cough x "months"-blood in phlegm just started"-NAD-steady gait

## 2021-07-27 IMAGING — CT CT ABD-PELV W/ CM
2 of 5 series · 17 of 46 positions shown, 19 images · IV contrast (Omnipaque)
Comparison: 06/06/2016

CLINICAL DATA: Nausea, vomiting, abdominal pain and diarrhea.
Remote appendectomy and hysterectomy.

EXAM:
CT ABDOMEN AND PELVIS WITH CONTRAST
TECHNIQUE: Multidetector CT imaging of the abdomen and pelvis was performed
using the standard protocol following bolus administration of
intravenous contrast.
CONTRAST:  100mL OMNIPAQUE IOHEXOL 300 MG/ML  SOLN

[Series 2: axial st · axial · 0.98mm/px · z∈[-493,-118]mm · 14 of 87 slices shown, 16 images]
[im 6/87  soft-tissue]
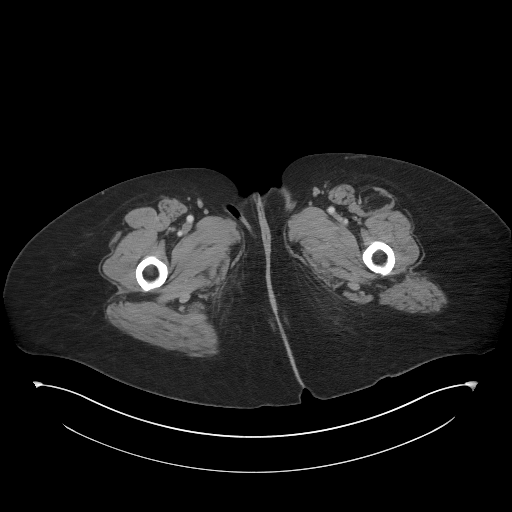
[im 6/87  bone]
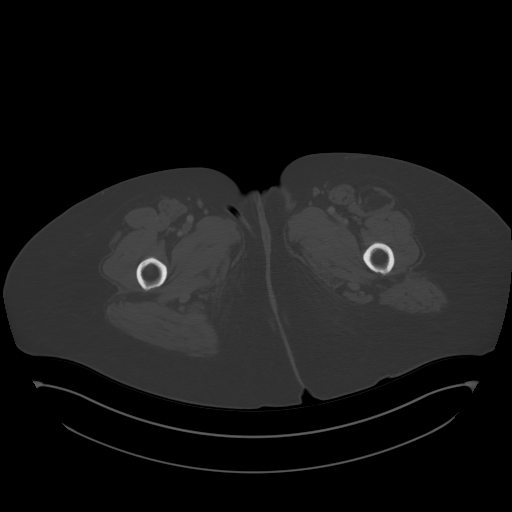
[im 11/87  soft-tissue]
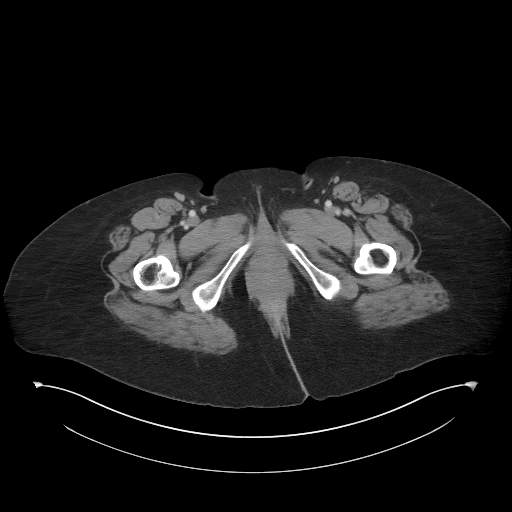
[im 16/87  soft-tissue]
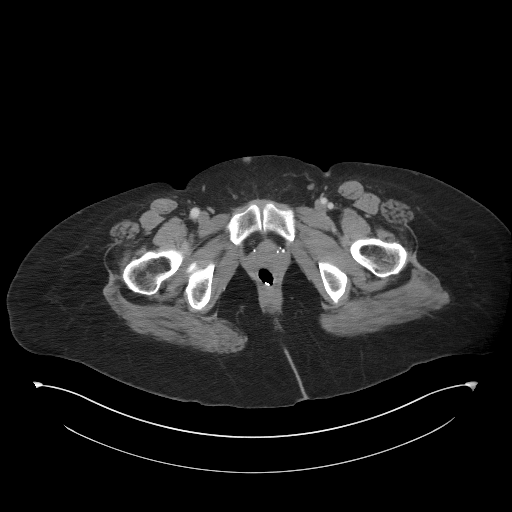
[im 26/87  soft-tissue]
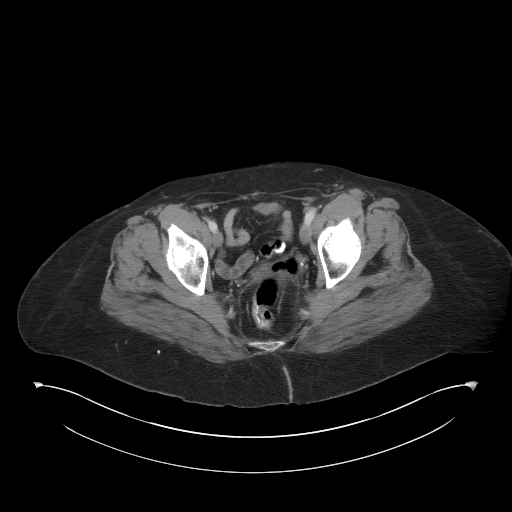
[im 31/87  soft-tissue]
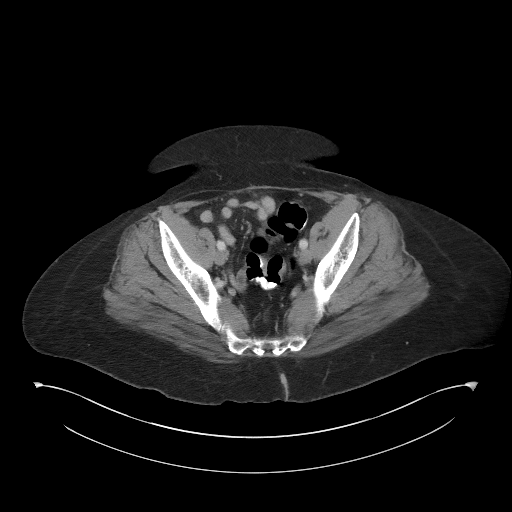
[im 36/87  soft-tissue]
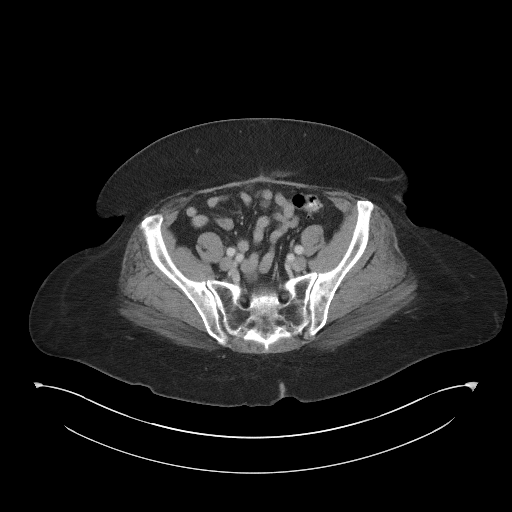
[im 41/87  soft-tissue]
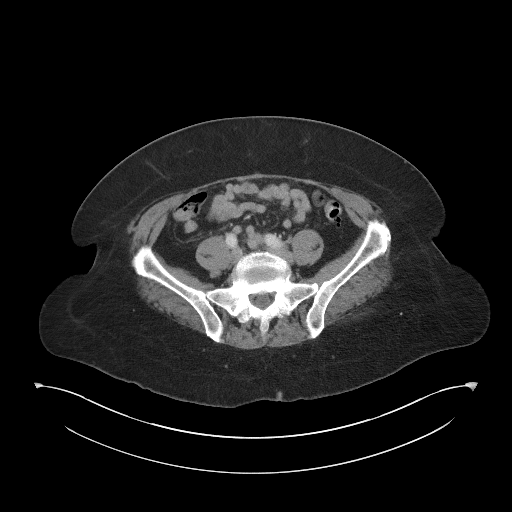
[im 46/87  soft-tissue]
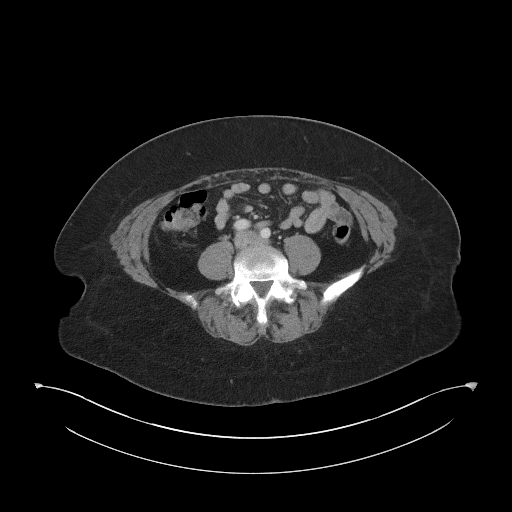
[im 51/87  soft-tissue]
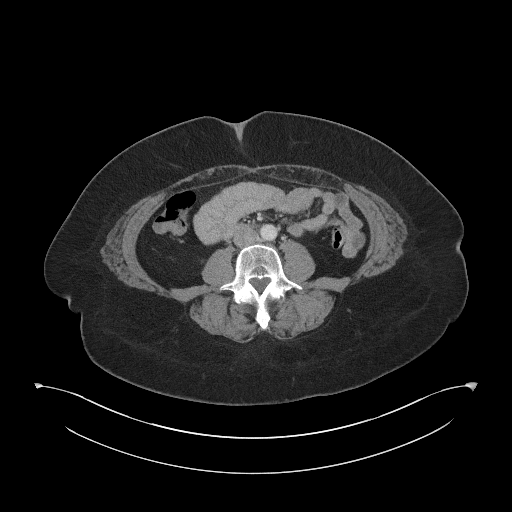
[im 51/87  bone]
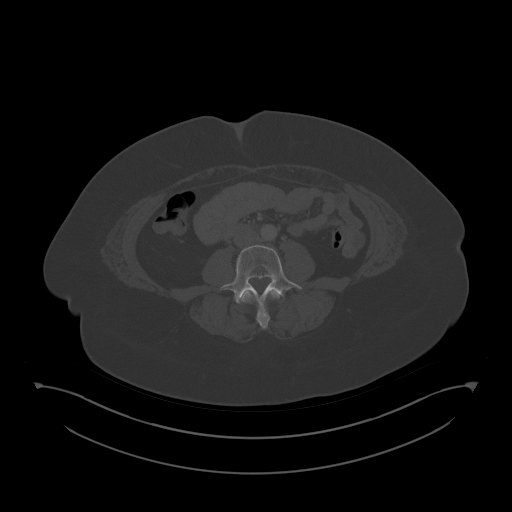
[im 56/87  soft-tissue]
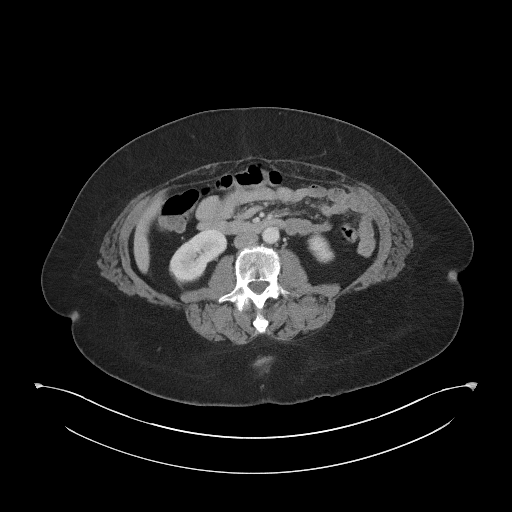
[im 66/87  soft-tissue]
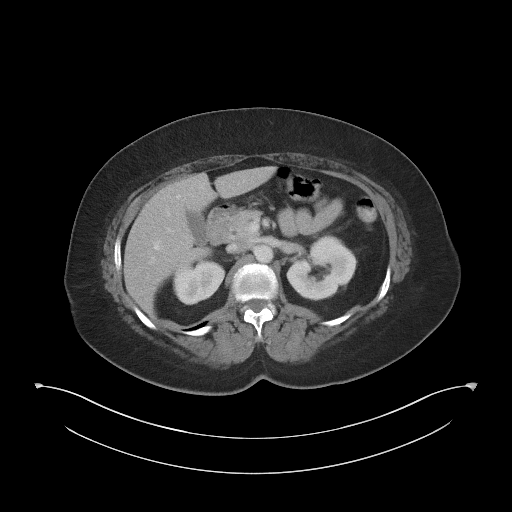
[im 71/87  soft-tissue]
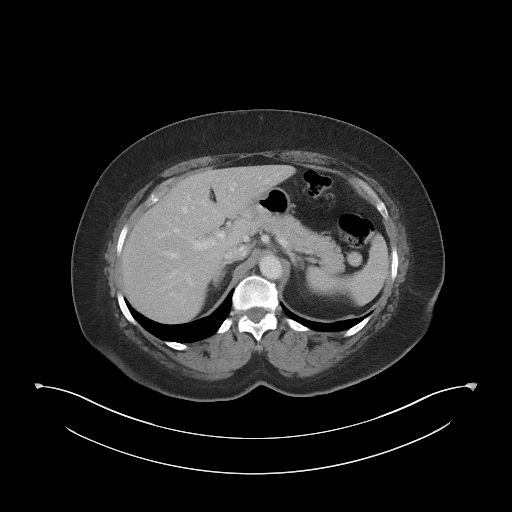
[im 76/87  soft-tissue]
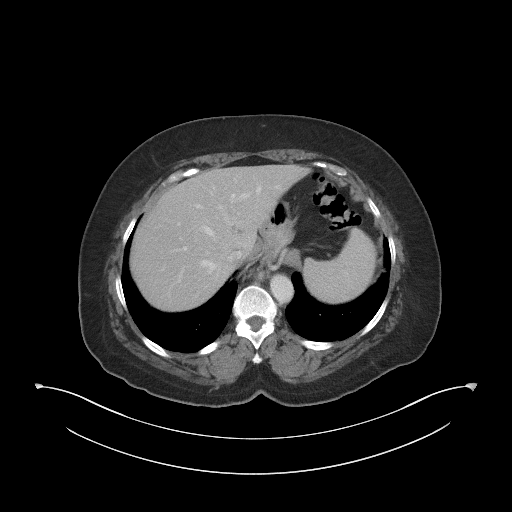
[im 81/87  soft-tissue]
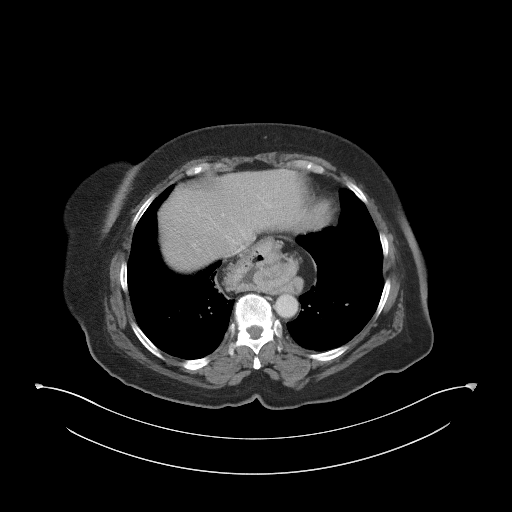

[Series 5: coronal st · coronal · 0.84mm/px · 3 of 97 slices shown]
[im 33/97  soft-tissue]
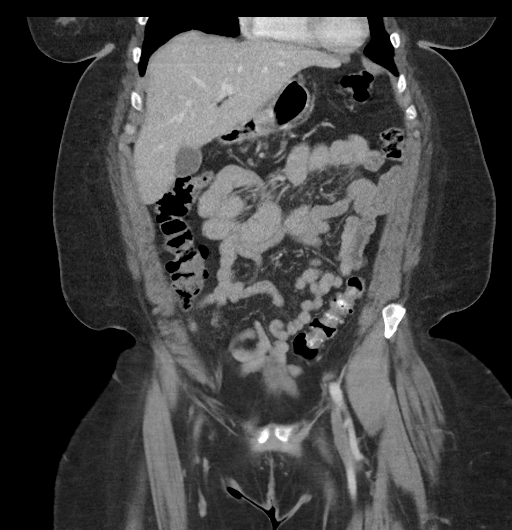
[im 43/97  soft-tissue]
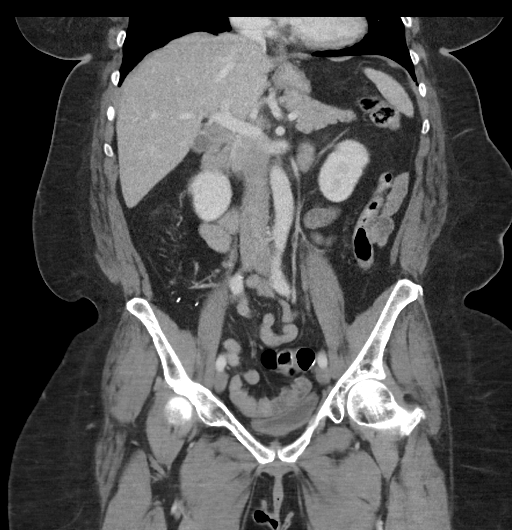
[im 54/97  soft-tissue]
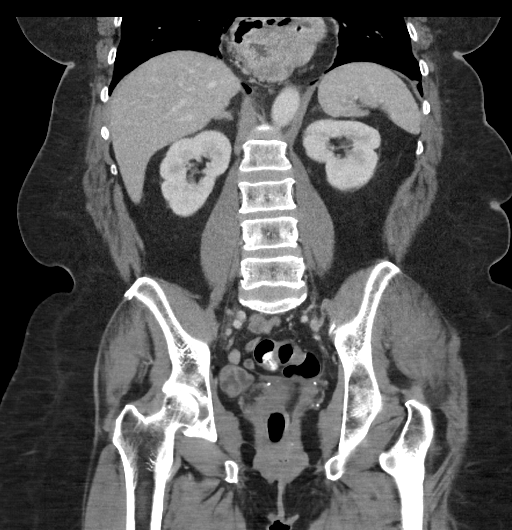

[17 of 46 positions shown; findings below may reference images not displayed]

FINDINGS: Lower chest: Large hiatal hernia, worse since 6347. Minor basilar
atelectasis versus scarring. No lower lobe pneumonia. Normal heart
size. No pericardial or pleural effusion.

Hepatobiliary: No focal liver abnormality is seen. No gallstones,
gallbladder wall thickening, or biliary dilatation.

Pancreas: Unremarkable. No pancreatic ductal dilatation or
surrounding inflammatory changes.

Spleen: Normal in size without focal abnormality.

Adrenals/Urinary Tract: Adrenal glands are unremarkable. Kidneys are
normal, without renal calculi, focal lesion, or hydronephrosis.
Bladder is unremarkable.

Stomach/Bowel: Negative for bowel obstruction, significant
dilatation, ileus, or free air. Minor scattered colonic
diverticulosis. No acute inflammatory process. Remote appendectomy
noted.

No free fluid, fluid collection, hemorrhage, hematoma abscess, or
ascites.

Vascular/Lymphatic: Very minor iliac atherosclerosis. Negative for
aneurysm. No occlusive process. Mesenteric and renal vasculature all
remain patent. No Umasi process appreciated. No bulky
adenopathy.

Reproductive: Status post hysterectomy. No adnexal masses.

Other: No abdominal wall hernia or abnormality. No abdominopelvic
ascites.

Musculoskeletal: Degenerative changes of the spine and lower lumbar
facet arthropathy.
IMPRESSION: No acute intra-abdominal or pelvic finding by CT.

Large hiatal hernia.

Previous appendectomy and hysterectomy

Minor diverticulosis without acute inflammatory process

## 2022-03-26 IMAGING — DX DG CHEST 1V PORT
1 series · 1 of 1 positions shown · non-contrast
Comparison: December 20, 2016.

CLINICAL DATA: Chest pain.  Cough.

EXAM:
PORTABLE CHEST 1 VIEW

[chest ap]
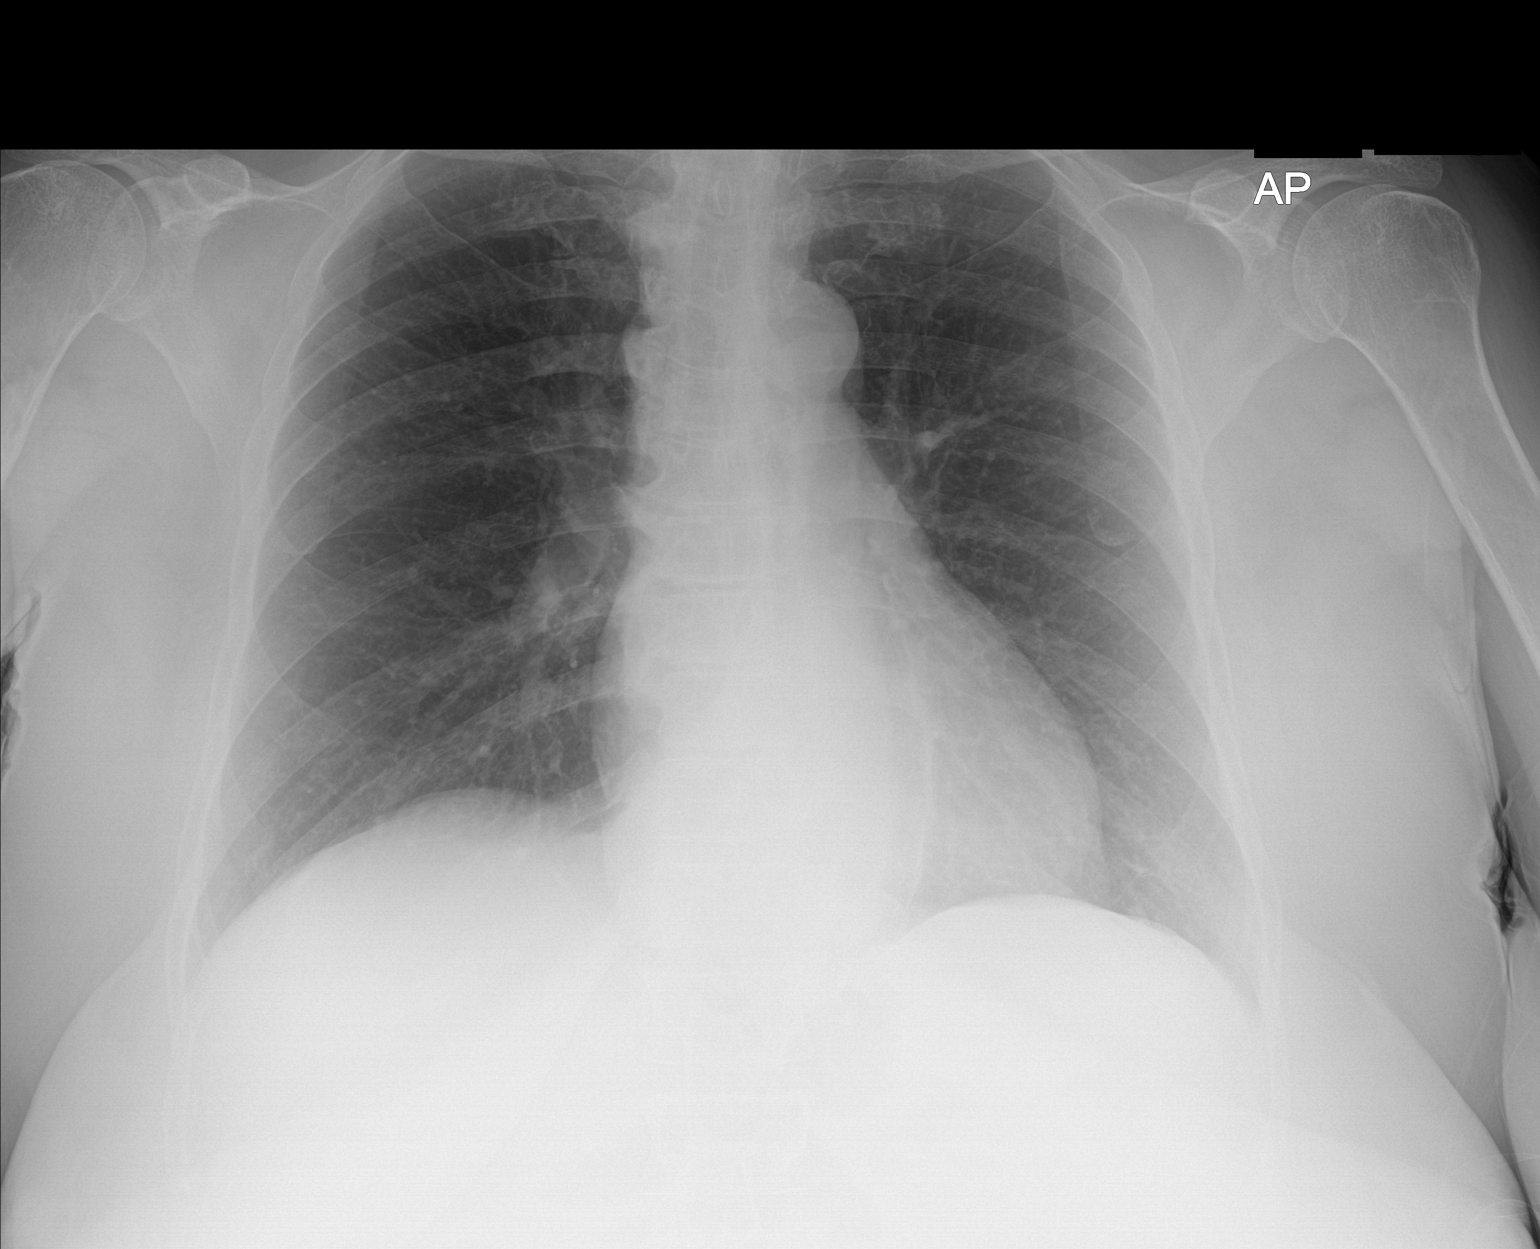

[1 of 1 positions shown; findings below may reference images not displayed]

FINDINGS: The heart size and mediastinal contours are within normal limits.
Both lungs are clear. No visible pleural effusions or pneumothorax.
No acute osseous abnormality.
IMPRESSION: No active disease.

## 2023-08-19 ENCOUNTER — Encounter: Payer: Self-pay | Admitting: *Deleted

## 2023-08-19 DIAGNOSIS — F4310 Post-traumatic stress disorder, unspecified: Secondary | ICD-10-CM | POA: Insufficient documentation

## 2023-08-19 DIAGNOSIS — G4730 Sleep apnea, unspecified: Secondary | ICD-10-CM | POA: Insufficient documentation

## 2023-08-19 DIAGNOSIS — E1142 Type 2 diabetes mellitus with diabetic polyneuropathy: Secondary | ICD-10-CM | POA: Insufficient documentation

## 2023-08-19 DIAGNOSIS — I10 Essential (primary) hypertension: Secondary | ICD-10-CM | POA: Insufficient documentation

## 2023-08-19 DIAGNOSIS — F1010 Alcohol abuse, uncomplicated: Secondary | ICD-10-CM | POA: Insufficient documentation

## 2023-08-19 DIAGNOSIS — M170 Bilateral primary osteoarthritis of knee: Secondary | ICD-10-CM | POA: Insufficient documentation

## 2023-08-19 DIAGNOSIS — E212 Other hyperparathyroidism: Secondary | ICD-10-CM | POA: Insufficient documentation

## 2023-08-19 DIAGNOSIS — G8929 Other chronic pain: Secondary | ICD-10-CM | POA: Insufficient documentation

## 2023-08-19 DIAGNOSIS — F1220 Cannabis dependence, uncomplicated: Secondary | ICD-10-CM | POA: Insufficient documentation

## 2023-08-19 DIAGNOSIS — K859 Acute pancreatitis without necrosis or infection, unspecified: Secondary | ICD-10-CM | POA: Insufficient documentation

## 2023-08-19 DIAGNOSIS — F339 Major depressive disorder, recurrent, unspecified: Secondary | ICD-10-CM | POA: Insufficient documentation

## 2023-08-19 DIAGNOSIS — E039 Hypothyroidism, unspecified: Secondary | ICD-10-CM | POA: Insufficient documentation

## 2023-08-19 DIAGNOSIS — I639 Cerebral infarction, unspecified: Secondary | ICD-10-CM | POA: Insufficient documentation

## 2023-08-19 DIAGNOSIS — M549 Dorsalgia, unspecified: Secondary | ICD-10-CM | POA: Insufficient documentation

## 2023-08-19 DIAGNOSIS — E1169 Type 2 diabetes mellitus with other specified complication: Secondary | ICD-10-CM | POA: Insufficient documentation

## 2023-08-19 NOTE — Progress Notes (Signed)
Abstracted chart using [facility] notes.

## 2023-08-20 ENCOUNTER — Encounter: Payer: Self-pay | Admitting: Neurology

## 2023-08-20 ENCOUNTER — Ambulatory Visit: Payer: No Typology Code available for payment source | Admitting: Neurology

## 2023-08-20 VITALS — BP 122/77 | HR 78 | Ht 61.0 in | Wt 202.0 lb

## 2023-08-20 DIAGNOSIS — R351 Nocturia: Secondary | ICD-10-CM

## 2023-08-20 DIAGNOSIS — R0683 Snoring: Secondary | ICD-10-CM | POA: Diagnosis not present

## 2023-08-20 DIAGNOSIS — G4719 Other hypersomnia: Secondary | ICD-10-CM

## 2023-08-20 DIAGNOSIS — G4733 Obstructive sleep apnea (adult) (pediatric): Secondary | ICD-10-CM

## 2023-08-20 DIAGNOSIS — E669 Obesity, unspecified: Secondary | ICD-10-CM | POA: Diagnosis not present

## 2023-08-20 DIAGNOSIS — Z8673 Personal history of transient ischemic attack (TIA), and cerebral infarction without residual deficits: Secondary | ICD-10-CM

## 2023-08-20 NOTE — Patient Instructions (Signed)

## 2023-08-20 NOTE — Progress Notes (Signed)
Subjective:    Patient ID: Clydette Privitera is a 67 y.o. female.  HPI    Huston Foley, MD, PhD [facility] [address], [address] [address] [place], [place] [address]  I saw patient, Lunette Tapp, is a referral from the [place] in [place], [place] for evaluation of her sleep disorder, in particular, concern for underlying obstructive sleep apnea.  The patient is accompanied by her daughter (who joined at the end of the visit) today.  Ms. Laine is a 67 year old female with an underlying medical history of diabetes, neuropathy, stroke with left-sided weakness, hyperlipidemia, hyperthyroidism, reflux disease, history of pancreatitis, and obesity, who reports snoring and excessive daytime somnolence.  Her Epworth sleepiness score is 11 out of 24, fatigue severity score is 59 out of 63.  She had a CPAP machine through the [place] for years but had not used it.  She was diagnosed with obstructive sleep apnea.  She gave her CPAP machine back a couple of months ago but per daughter had not used it for many years.  A compliance download was not available for my review today.  Prior sleep study results are not available for my review today.  [facility]  She goes to bed around 10 or 11 but has trouble sleeping.  She has a rise time around 5 AM.  She drinks caffeine in the form of coffee, about 2 cups/day and some herbal tea.  She does not currently drink any alcohol.  She quit smoking in October 2023.  She lives with her daughter, granddaughter and great grandchild.  She reports that she had trouble tolerating the mask in the past.  She has nocturia about 2-3 times per average night.  Her Past Medical History Is Significant For: Past Medical History:  Diagnosis Date   Acid reflux    Diabetes mellitus without complication (HCC)    High cholesterol    Hyperthyroidism    Neuropathy    Pancreatitis     Her Past Surgical History Is Significant For: Past Surgical History:   Procedure Laterality Date   ABDOMINAL HYSTERECTOMY     ABDOMINAL SURGERY     TONSILLECTOMY      Her Family History Is Significant For: Family History  Problem Relation Age of Onset   Sleep apnea Mother     Her Social History Is Significant For: Social History   Socioeconomic History   Marital status: Single    Spouse name: Not on file   Number of children: Not on file   Years of education: Not on file   Highest education level: Not on file  Occupational History   Not on file  Tobacco Use   Smoking status: Never   Smokeless tobacco: Never  Substance and Sexual Activity   Alcohol use: Yes    Comment: occ   Drug use: Not Currently    Types: Marijuana   Sexual activity: Not on file  Other Topics Concern   Not on file  Social History Narrative   Not on file   Social Determinants of Health   Financial Resource Strain: Not on file  Food Insecurity: Low Risk  (11/21/2022)   Received from [facility]   Hunger Vital Sign    Worried About Running Out of Food in the Last Year: Never true    Within the past 12 months, the food you bought just didn't last and you didn't have money to get more: Not on file  Transportation Needs: Not  on file (11/21/2022)  Physical Activity: Not on file  Stress: Not on file  Social Connections: Unknown (03/24/2022)   Received from [facility]   Social Network    Social Network: Not on file    Her Allergies Are:  Allergies  Allergen Reactions   Codeine     unknown  :   Her Current Medications Are:  Outpatient Encounter Medications as of 08/20/2023  Medication Sig   aspirin 81 MG chewable tablet Chew 81 mg by mouth daily.   atorvastatin (LIPITOR) 40 MG tablet Take 40 mg by mouth at bedtime.   Calcium Carbonate-Vitamin D (CALCIUM 600+D HIGH POTENCY) 600-400 MG-UNIT tablet Take 1 tablet by mouth daily.   cetirizine (ZYRTEC) 10 MG tablet Take 10 mg by mouth daily as needed for allergies.   cholecalciferol (VITAMIN D) 1000 units tablet  Take 1,000 Units by mouth daily.   cyclobenzaprine (FLEXERIL) 10 MG tablet Take 10 mg by mouth 3 (three) times daily as needed for muscle spasms.   gabapentin (NEURONTIN) 800 MG tablet Take 800 mg by mouth 3 (three) times daily.   metFORMIN (GLUCOPHAGE-XR) 500 MG 24 hr tablet Take 500 mg by mouth daily with breakfast.   nortriptyline (PAMELOR) 10 MG capsule Take 10 mg by mouth at bedtime.   ondansetron (ZOFRAN ODT) 4 MG disintegrating tablet Take 1 tablet (4 mg total) by mouth every 8 (eight) hours as needed for nausea or vomiting.   ondansetron (ZOFRAN) 4 MG tablet Take 1 tablet (4 mg total) by mouth every 8 (eight) hours as needed for nausea or vomiting.   oxybutynin (DITROPAN-XL) 10 MG 24 hr tablet Take 10 mg by mouth at bedtime.   pantoprazole (PROTONIX) 40 MG tablet Take 40 mg by mouth every morning.   benzonatate (TESSALON) 100 MG capsule Take 1 capsule (100 mg total) by mouth every 8 (eight) hours.   clopidogrel (PLAVIX) 75 MG tablet Take 75 mg by mouth daily.   docusate sodium (COLACE) 50 MG capsule Take 50 mg by mouth 2 (two) times daily.   famotidine (PEPCID) 20 MG tablet Take 1 tablet (20 mg total) by mouth 2 (two) times daily.   fluticasone (FLONASE) 50 MCG/ACT nasal spray Place 2 sprays into both nostrils daily for 14 days.   HYDROcodone-acetaminophen (NORCO/VICODIN) 5-325 MG tablet Take 1-2 tablets by mouth every 6 (six) hours as needed.   loratadine (CLARITIN) 10 MG tablet Take 10 mg by mouth daily.   methimazole (TAPAZOLE) 5 MG tablet Take 5 mg by mouth daily.   oseltamivir (TAMIFLU) 75 MG capsule Take 1 capsule (75 mg total) by mouth every 12 (twelve) hours.   predniSONE (DELTASONE) 20 MG tablet Take 1 tablet (20 mg total) by mouth 2 (two) times daily with a meal.   promethazine (PHENERGAN) 25 MG tablet Take 1 tablet (25 mg total) by mouth every 6 (six) hours as needed for nausea or vomiting.   promethazine-dextromethorphan (PROMETHAZINE-DM) 6.25-15 MG/5ML syrup Take 5 mLs by  mouth 4 (four) times daily as needed for cough.   simvastatin (ZOCOR) 20 MG tablet Take 20 mg by mouth at bedtime.   No facility-administered encounter medications on file as of 08/20/2023.  :   Review of Systems:  Out of a complete 14 point review of systems, all are reviewed and negative with the exception of these symptoms as listed below:  Review of Systems  Neurological:        Pt here for sleep consult Pt snores,fatigue,hypertension, headaches,stroke hx  Pt last sleep  study 7 years. Pt states only used CPAP machine for a month . Pt wants to discuss inspire    ESS:11 FSS:59     Objective:  Neurological Exam  Physical Exam Physical Examination:   Vitals:   08/20/23 1346  BP: 122/77  Pulse: 78    General Examination: The patient is a very pleasant 67 y.o. female in no acute distress. She appears well-developed and well-nourished and well groomed.   HEENT: Normocephalic, atraumatic, pupils are equal, round and reactive to light, extraocular tracking is good without limitation to gaze excursion or nystagmus noted. Hearing is grossly intact. Face is mildly asymmetric with left facial droop noted.  Speech is mildly dysarthric, no hypophonia or voice tremor.  Airway examination reveals mild mouth dryness.  Tongue protrudes centrally and palate elevates symmetrically, Mallampati class III, moderate airway crowding, tonsils absent.  No carotid bruits.   Marginal dental hygiene noted, she is advised to make an appointment with dentistry.  She reports that she was trying to get an appointment with dentistry but did not get a call back.  Chest: Clear to auscultation without wheezing, rhonchi or crackles noted.  Heart: S1+S2+0, regular and normal without murmurs, rubs or gallops noted.   Abdomen: Soft, non-tender and non-distended.  Extremities: There is bilateral ankle puffiness noted.    Skin: Warm and dry without trophic changes noted.   Musculoskeletal: exam reveals no  obvious joint deformities.   Neurologically:  Mental status: The patient is awake, alert and oriented in all 4 spheres. Her immediate and remote memory, attention, language skills and fund of knowledge are appropriate. There is no evidence of aphasia, agnosia, apraxia or anomia. Speech is clear with normal prosody and enunciation. Thought process is linear. Mood is normal and affect is normal.  Cranial nerves II - XII are as described above under HEENT exam.  Motor exam: Normal bulk, strength and tone is noted.  She has mild left grip strength weakness, left lower extremity weakness is 4 out of 5, otherwise strength is fairly good.   Fine motor skills and coordination: grossly intact.  Cerebellar testing: No dysmetria or intention tremor. There is no truncal or gait ataxia.  Sensory exam: intact to light touch in the upper and lower extremities.  Gait, station and balance: She stands slowly and pushes herself up, she requires no assistance.  She walks with a single-point cane.  Assessment and Plan:  In summary, Torina Ilean Spradlin is a very pleasant 67 y.o.-year old female with an underlying medical history of diabetes, neuropathy, stroke with left-sided weakness, hyperlipidemia, hyperthyroidism, reflux disease, history of pancreatitis, and obesity, who presents for evaluation of her obstructive sleep apnea.  She carries a prior diagnosis of obstructive sleep apnea several years ago and had a CPAP machine through the [place].  She had not used her machine in many years and gave it back. A laboratory attended sleep study is typically considered "gold standard" for evaluation of sleep disordered breathing.   I had a long chat with the patient and her daughter about my findings and the diagnosis of sleep apnea, particularly OSA, its prognosis and treatment options. We talked about medical/conservative treatments, surgical interventions and non-pharmacological approaches for symptom control. I explained, in  particular, the risks and ramifications of untreated moderate to severe OSA, especially with respect to developing cardiovascular disease down the road, including congestive heart failure (CHF), difficult to treat hypertension, cardiac arrhythmias (particularly A-fib), neurovascular complications including TIA, stroke and dementia. Even type 2 diabetes has,  in part, been linked to untreated OSA. Symptoms of untreated OSA may include (but may not be limited to) daytime sleepiness, nocturia (i.e. frequent nighttime urination), memory problems, mood irritability and suboptimally controlled or worsening mood disorder such as depression and/or anxiety, lack of energy, lack of motivation, physical discomfort, as well as recurrent headaches, especially morning or nocturnal headaches. We talked about the importance of maintaining a healthy lifestyle and striving for healthy weight.  I recommended a sleep study at this time.  We will proceed with a laboratory attended sleep study.  I outlined possible surgical and non-surgical treatment options of OSA, including the use of a positive airway pressure (PAP) device (i.e. CPAP, AutoPAP/APAP or BiPAP in certain circumstances), a custom-made dental device (aka oral appliance, which would require a referral to a specialist dentist or orthodontist typically, and is generally speaking not considered for patients with full dentures or edentulous state), upper airway surgical options, such as traditional UPPP (which is not considered a first-line treatment) or the Inspire device (hypoglossal nerve stimulator, which would involve a referral for consultation with an ENT surgeon, after careful selection, following inclusion criteria - also not first-line treatment). I explained the PAP treatment option to the patient in detail, as this is generally considered first-line treatment.  The patient indicated that she would be willing to try PAP therapy again, if the need arises.  She  indicates that she would want to get her CPAP machine through the [place] again.   We will keep her posted as to her sleep study results by phone call and plan to follow-up if needed. I answered all the questions today and the patient and her were in agreement. Huston Foley, MD, PhD

## 2023-09-07 ENCOUNTER — Telehealth: Payer: Self-pay | Admitting: Neurology

## 2023-09-07 NOTE — Telephone Encounter (Signed)
[facility]- [place] [facility]: [id number] (exp. 07/27/23 to 01/23/24)   Patient is scheduled at [facility] for 09/27/23 at 9 pm.  Mailed packet to the patient.

## 2023-10-12 ENCOUNTER — Encounter (HOSPITAL_COMMUNITY): Payer: Self-pay

## 2023-10-12 ENCOUNTER — Emergency Department (HOSPITAL_COMMUNITY)
Admission: EM | Admit: 2023-10-12 | Discharge: 2023-10-12 | Disposition: A | Discharge status: Home / Self Care | Payer: No Typology Code available for payment source | Attending: Emergency Medicine | Admitting: Emergency Medicine

## 2023-10-12 ENCOUNTER — Emergency Department (HOSPITAL_COMMUNITY): Payer: No Typology Code available for payment source

## 2023-10-12 ENCOUNTER — Other Ambulatory Visit: Payer: Self-pay

## 2023-10-12 DIAGNOSIS — W010XXA Fall on same level from slipping, tripping and stumbling without subsequent striking against object, initial encounter: Secondary | ICD-10-CM | POA: Diagnosis not present

## 2023-10-12 DIAGNOSIS — W19XXXA Unspecified fall, initial encounter: Secondary | ICD-10-CM

## 2023-10-12 DIAGNOSIS — Y9301 Activity, walking, marching and hiking: Secondary | ICD-10-CM | POA: Diagnosis not present

## 2023-10-12 DIAGNOSIS — Z7902 Long term (current) use of antithrombotics/antiplatelets: Secondary | ICD-10-CM | POA: Insufficient documentation

## 2023-10-12 DIAGNOSIS — Z7982 Long term (current) use of aspirin: Secondary | ICD-10-CM | POA: Diagnosis not present

## 2023-10-12 DIAGNOSIS — R519 Headache, unspecified: Secondary | ICD-10-CM | POA: Diagnosis present

## 2023-10-12 DIAGNOSIS — M542 Cervicalgia: Secondary | ICD-10-CM | POA: Insufficient documentation

## 2023-10-12 LAB — CBC WITH DIFFERENTIAL/PLATELET
Abs Immature Granulocytes: 0.01 10*3/uL (ref 0.00–0.07)
Basophils Absolute: 0 10*3/uL (ref 0.0–0.1)
Basophils Relative: 0 %
Eosinophils Absolute: 0.1 10*3/uL (ref 0.0–0.5)
Eosinophils Relative: 2 %
HCT: 39 % (ref 36.0–46.0)
Hemoglobin: 13.1 g/dL (ref 12.0–15.0)
Immature Granulocytes: 0 %
Lymphocytes Relative: 45 %
Lymphs Abs: 3 10*3/uL (ref 0.7–4.0)
MCH: 30.5 pg (ref 26.0–34.0)
MCHC: 33.6 g/dL (ref 30.0–36.0)
MCV: 90.9 fL (ref 80.0–100.0)
Monocytes Absolute: 0.5 10*3/uL (ref 0.1–1.0)
Monocytes Relative: 7 %
Neutro Abs: 3 10*3/uL (ref 1.7–7.7)
Neutrophils Relative %: 46 %
Platelets: 209 10*3/uL (ref 150–400)
RBC: 4.29 MIL/uL (ref 3.87–5.11)
RDW: 13.3 % (ref 11.5–15.5)
WBC: 6.6 10*3/uL (ref 4.0–10.5)
nRBC: 0 % (ref 0.0–0.2)

## 2023-10-12 LAB — URINALYSIS, ROUTINE W REFLEX MICROSCOPIC
Bacteria, UA: NONE SEEN
Bilirubin Urine: NEGATIVE
Glucose, UA: >=500 mg/dL — AB
Ketones, ur: NEGATIVE mg/dL
Nitrite: NEGATIVE
Protein, ur: NEGATIVE mg/dL
Specific Gravity, Urine: 1.013 (ref 1.005–1.030)
pH: 5 (ref 5.0–8.0)

## 2023-10-12 LAB — COMPREHENSIVE METABOLIC PANEL
ALT: 18 U/L (ref 0–44)
AST: 22 U/L (ref 15–41)
Albumin: 4.1 g/dL (ref 3.5–5.0)
Alkaline Phosphatase: 110 U/L (ref 38–126)
Anion gap: 8 (ref 5–15)
BUN: 16 mg/dL (ref 8–23)
CO2: 25 mmol/L (ref 22–32)
Calcium: 9.5 mg/dL (ref 8.9–10.3)
Chloride: 101 mmol/L (ref 98–111)
Creatinine, Ser: 0.88 mg/dL (ref 0.44–1.00)
GFR, Estimated: >60 mL/min (ref >60)
Glucose, Bld: 222 mg/dL — ABNORMAL HIGH (ref 70–99)
Potassium: 3.9 mmol/L (ref 3.5–5.1)
Sodium: 134 mmol/L — ABNORMAL LOW (ref 135–145)
Total Bilirubin: 0.5 mg/dL (ref <1.2)
Total Protein: 7.6 g/dL (ref 6.5–8.1)

## 2023-10-12 MED ORDER — ACETAMINOPHEN 500 MG PO TABS
1000.0000 mg | ORAL_TABLET | Freq: Once | ORAL | Status: AC
  Administered 2023-10-12: 1000 mg via ORAL
  Filled 2023-10-12: qty 2

## 2023-10-12 NOTE — Discharge Instructions (Addendum)
You have been seen and discharged from the emergency department.  The CT and x-ray imaging were normal.  Your blood work and urinalysis was normal.  Follow-up with your primary provider for further evaluation and further care. Take home medications as prescribed. If you have any worsening symptoms or further concerns for your health please return to an emergency department for further evaluation.

## 2023-10-12 NOTE — ED Provider Notes (Signed)
[place] EMERGENCY DEPARTMENT AT [facility] Provider Note   CSN: [id number] Arrival date & time: 10/12/23  1359     History  Chief Complaint  Patient presents with   Selena Long is a 67 y.o. female.  HPI   67 year old female presents emergency department after mechanical fall.  Patient states that she was walking into the family room when she slipped on a gift bag and fell forward.  She says she mainly landed on her anterior torso, knocking the wind out of her.  However she is now complaining of a posterior headache and some upper neck pain.  Denies any facial injury or trauma.  She was not knocked unconscious.  There was no syncope.  She does not take any blood thinning medication.  Patient also reveals that she has been having an intermittent headache for the past 3 days but does self resolve.  This is different than the discomfort that she feels currently.  No sudden onset/worst headache of her life.  Feels generally weak.  Denies any focal complaints including vision loss, speech change, facial droop, weakness in 1 arm/leg.  Home Medications Prior to Admission medications   Medication Sig Start Date End Date Taking? Authorizing Provider  aspirin 81 MG chewable tablet Chew 81 mg by mouth daily.    [provider]  atorvastatin (LIPITOR) 40 MG tablet Take 40 mg by mouth at bedtime.    [provider]  benzonatate (TESSALON) 100 MG capsule Take 1 capsule (100 mg total) by mouth every 8 (eight) hours. 11/21/20   Gailen Shelter, PA  Calcium Carbonate-Vitamin D (CALCIUM 600+D HIGH POTENCY) 600-400 MG-UNIT tablet Take 1 tablet by mouth daily.    [provider]  cetirizine (ZYRTEC) 10 MG tablet Take 10 mg by mouth daily as needed for allergies.    [provider]  cholecalciferol (VITAMIN D) 1000 units tablet Take 1,000 Units by mouth daily.    [provider]  clopidogrel (PLAVIX) 75 MG tablet Take 75 mg by  mouth daily.    [provider]  cyclobenzaprine (FLEXERIL) 10 MG tablet Take 10 mg by mouth 3 (three) times daily as needed for muscle spasms.    [provider]  docusate sodium (COLACE) 50 MG capsule Take 50 mg by mouth 2 (two) times daily.    [provider]  famotidine (PEPCID) 20 MG tablet Take 1 tablet (20 mg total) by mouth 2 (two) times daily. 06/06/16   Jacalyn Lefevre, MD  fluticasone (FLONASE) 50 MCG/ACT nasal spray Place 2 sprays into both nostrils daily for 14 days. 11/21/20 12/05/20  Gailen Shelter, PA  gabapentin (NEURONTIN) 800 MG tablet Take 800 mg by mouth 3 (three) times daily.    [provider]  HYDROcodone-acetaminophen (NORCO/VICODIN) 5-325 MG tablet Take 1-2 tablets by mouth every 6 (six) hours as needed. 05/20/16   Ward, Layla Maw, DO  loratadine (CLARITIN) 10 MG tablet Take 10 mg by mouth daily.    [provider]  metFORMIN (GLUCOPHAGE-XR) 500 MG 24 hr tablet Take 500 mg by mouth daily with breakfast.    [provider]  methimazole (TAPAZOLE) 5 MG tablet Take 5 mg by mouth daily.    [provider]  nortriptyline (PAMELOR) 10 MG capsule Take 10 mg by mouth at bedtime.    [provider]  ondansetron (ZOFRAN ODT) 4 MG disintegrating tablet Take 1 tablet (4 mg total) by mouth every 8 (eight) hours as  needed for nausea or vomiting. 11/21/20   Gailen Shelter, PA  ondansetron (ZOFRAN) 4 MG tablet Take 1 tablet (4 mg total) by mouth every 8 (eight) hours as needed for nausea or vomiting. 03/12/20   Terrilee Files, MD  oseltamivir (TAMIFLU) 75 MG capsule Take 1 capsule (75 mg total) by mouth every 12 (twelve) hours. 12/20/16   Rolland Porter, MD  oxybutynin (DITROPAN-XL) 10 MG 24 hr tablet Take 10 mg by mouth at bedtime.    [provider]  pantoprazole (PROTONIX) 40 MG tablet Take 40 mg by mouth every morning.    [provider]  predniSONE (DELTASONE) 20 MG tablet Take 1 tablet (20 mg total)  by mouth 2 (two) times daily with a meal. 12/20/16   Rolland Porter, MD  promethazine (PHENERGAN) 25 MG tablet Take 1 tablet (25 mg total) by mouth every 6 (six) hours as needed for nausea or vomiting. 06/06/16   Jacalyn Lefevre, MD  promethazine-dextromethorphan (PROMETHAZINE-DM) 6.25-15 MG/5ML syrup Take 5 mLs by mouth 4 (four) times daily as needed for cough. 11/21/20   Gailen Shelter, PA  simvastatin (ZOCOR) 20 MG tablet Take 20 mg by mouth at bedtime.    [provider]      Allergies    Codeine and Penicillins    Review of Systems   Review of Systems  Constitutional:  Positive for fatigue. Negative for fever.  Eyes:  Negative for visual disturbance.  Respiratory:  Negative for shortness of breath.   Cardiovascular:  Negative for chest pain.  Gastrointestinal:  Negative for abdominal pain, diarrhea and vomiting.  Musculoskeletal:  Positive for neck pain. Negative for neck stiffness.  Skin:  Negative for rash.  Neurological:  Positive for headaches. Negative for syncope, facial asymmetry, speech difficulty, weakness and numbness.    Physical Exam Updated Vital Signs BP 126/83   Pulse 73   Temp 97.6 F (36.4 C) (Oral)   Resp 18   Ht 5\' 1"  (1.549 m)   Wt 86.2 kg   SpO2 100%   BMI 35.90 kg/m  Physical Exam Vitals and nursing note reviewed.  Constitutional:      General: She is not in acute distress.    Appearance: Normal appearance.  HENT:     Head: Normocephalic.     Mouth/Throat:     Mouth: Mucous membranes are moist.  Eyes:     Extraocular Movements: Extraocular movements intact.     Pupils: Pupils are equal, round, and reactive to light.  Neck:     Comments: Mild tenderness to palpation around the occiput, no other midline cervical spine pain Cardiovascular:     Rate and Rhythm: Normal rate.  Pulmonary:     Effort: Pulmonary effort is normal. No respiratory distress.  Abdominal:     Palpations: Abdomen is soft.     Tenderness: There is no abdominal  tenderness.  Skin:    General: Skin is warm.  Neurological:     General: No focal deficit present.     Mental Status: She is alert and oriented to person, place, and time. Mental status is at baseline.     Cranial Nerves: No cranial nerve deficit.  Psychiatric:        Mood and Affect: Mood normal.     ED Results / Procedures / Treatments   Labs (all labs ordered are listed, but only abnormal results are displayed) Labs Reviewed  COMPREHENSIVE METABOLIC PANEL - Abnormal; Notable for the following components:  Result Value   Sodium 134 (*)    Glucose, Bld 222 (*)    All other components within normal limits  URINALYSIS, ROUTINE W REFLEX MICROSCOPIC - Abnormal; Notable for the following components:   Color, Urine STRAW (*)    Glucose, UA >=500 (*)    Hgb urine dipstick SMALL (*)    Leukocytes,Ua TRACE (*)    All other components within normal limits  CBC WITH DIFFERENTIAL/PLATELET    EKG EKG Interpretation Date/Time:  Monday October 12 2023 15:34:28 EST Ventricular Rate:  69 PR Interval:  192 QRS Duration:  83 QT Interval:  410 QTC Calculation: 440 R Axis:   0  Text Interpretation: Sinus rhythm Indeterminate axis Probable lateral infarct, old Confirmed by Coralee Pesa [id number]) on 10/12/2023 7:27:35 PM  Radiology No results found.  Procedures Procedures    Medications Ordered in ED Medications  acetaminophen (TYLENOL) tablet 1,000 mg (1,000 mg Oral Given 10/12/23 1531)    ED Course/ Medical Decision Making/ A&P                                 Medical Decision Making Amount and/or Complexity of Data Reviewed Radiology: ordered.   67 year old female presents emergency department with mechanical fall forward onto her anterior torso.  No head injury, syncope.  However she is complaining of a posterior headache and upper neck pain.  Lab evaluation is normal for the patient.  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  CT cervical spine is unremarkable.  Chest x-ray is normal.  Patient at this time appears safe and stable for discharge and close outpatient follow up. Discharge plan and strict return to ED precautions discussed, patient verbalizes understanding and agreement.        Final Clinical Impression(s) / ED Diagnoses Final diagnoses:  None    Rx / DC Orders ED Discharge Orders     None         Rozelle Logan, DO 10/12/23 2328

## 2023-10-12 NOTE — ED Triage Notes (Signed)
Pt fell this morning on to her face. Pt endorses facial pain, denies any LOC or blood thinner use. Denies pain where where else. Denies any S/S of stroke at this time.

## 2023-10-12 NOTE — ED Provider Triage Note (Signed)
Emergency Medicine Provider Triage Evaluation Note  Rosealee Albee , a 67 y.o. female  was evaluated in triage.  Pt complains of fall, HA.  Review of Systems  Positive: HA x 3 days, fell this morning hit right head Negative: Nausea, vomitin  Physical Exam  BP 109/83 (BP Location: Right Arm)   Pulse 77   Temp 98.5 F (36.9 C) (Oral)   Resp 16   Ht 5\' 1"  (1.549 m)   Wt 86.2 kg   SpO2 95%   BMI 35.90 kg/m  Gen:   Awake, no distress   Resp:  Normal effort  MSK:   Moves extremities without difficulty  Other:  Tender over right temporal scalp. No neck tenderness.   Medical Decision Making  Medically screening exam initiated at 3:15 PM.  Appropriate orders placed.  Bryssa Leonettie Monk was informed that the remainder of the evaluation will be completed by another provider, this initial triage assessment does not replace that evaluation, and the importance of remaining in the ED until their evaluation is complete.  Larey Seat this am after having headache x 3 days. Was weak and dizzy and she just saw down on the floor.    Elpidio Anis, PA-C 10/12/23 1518

## 2023-11-09 ENCOUNTER — Ambulatory Visit: Payer: Medicare Other | Admitting: Neurology

## 2023-11-09 DIAGNOSIS — R351 Nocturia: Secondary | ICD-10-CM

## 2023-11-09 DIAGNOSIS — E669 Obesity, unspecified: Secondary | ICD-10-CM

## 2023-11-09 DIAGNOSIS — Z8673 Personal history of transient ischemic attack (TIA), and cerebral infarction without residual deficits: Secondary | ICD-10-CM

## 2023-11-09 DIAGNOSIS — R0683 Snoring: Secondary | ICD-10-CM

## 2023-11-09 DIAGNOSIS — G4719 Other hypersomnia: Secondary | ICD-10-CM

## 2023-11-09 DIAGNOSIS — G4733 Obstructive sleep apnea (adult) (pediatric): Secondary | ICD-10-CM

## 2023-11-17 ENCOUNTER — Ambulatory Visit (INDEPENDENT_AMBULATORY_CARE_PROVIDER_SITE_OTHER): Payer: No Typology Code available for payment source | Admitting: Neurology

## 2023-11-17 DIAGNOSIS — R0683 Snoring: Secondary | ICD-10-CM

## 2023-11-17 DIAGNOSIS — G4733 Obstructive sleep apnea (adult) (pediatric): Secondary | ICD-10-CM | POA: Diagnosis not present

## 2023-11-17 DIAGNOSIS — G4719 Other hypersomnia: Secondary | ICD-10-CM

## 2023-11-17 DIAGNOSIS — E669 Obesity, unspecified: Secondary | ICD-10-CM

## 2023-11-17 DIAGNOSIS — G4720 Circadian rhythm sleep disorder, unspecified type: Secondary | ICD-10-CM

## 2023-11-17 DIAGNOSIS — R351 Nocturia: Secondary | ICD-10-CM

## 2023-11-17 DIAGNOSIS — Z8673 Personal history of transient ischemic attack (TIA), and cerebral infarction without residual deficits: Secondary | ICD-10-CM

## 2023-11-24 ENCOUNTER — Telehealth: Payer: Self-pay | Admitting: *Deleted

## 2023-11-24 NOTE — Procedures (Signed)
 Physician Interpretation:     [facility] POLYSOMNOGRAPHY  INTERPRETATION REPORT   STUDY DATE:  11/17/2023     PATIENT NAME:  Selena Long         DATE OF BIRTH:  1956-08-15  PATIENT ID:  [id number]    TYPE OF STUDY:  PSG  READING PHYSICIAN: True Mar, MD, PhD   SCORING TECHNICIAN: Donnice Counts, RPSGT   Referred by: Center, [facility]  ? History and Indication for Testing: 68 year old female with an underlying medical history of diabetes, neuropathy, stroke with left-sided weakness, hyperlipidemia, hyperthyroidism, reflux disease, history of pancreatitis, and obesity, who reports snoring and excessive daytime somnolence. Her Epworth sleepiness score is 11 out of 24, fatigue severity score is 59 out of 63. She had a CPAP machine through the [facility] for years but had not used it.  Height: 61 in Weight: 202 lb (BMI 38) Neck Size: 0 in    MEDICATIONS: Aspirin, Lipitor, Calcium carbonate vitamin D, Vitamin D, Flexeril, Neurontin, Glucophage XR, Pamelor, Zofran , Ditropan Xl, Protonix , Tessalon , Plavix, Colace, Pepcid , Flonase , Norco, Claritin, Tapazole, Tamiflu , Deltasone , Phenergan , Promethazine , Zocor   TECHNICAL DESCRIPTION: A registered sleep technologist was in attendance for the duration of the recording.  Data collection, scoring, video monitoring, and reporting were performed in compliance with the AASM Manual for the Scoring of Sleep and Associated Events; (Hypopnea is scored based on the criteria listed in Section VIII D. 1b in the AASM Manual V2.6 using a 4% oxygen desaturation rule or Hypopnea is scored based on the criteria listed in Section VIII D. 1a in the AASM Manual V2.6 using 3% oxygen desaturation and /or arousal rule).   SLEEP CONTINUITY AND SLEEP ARCHITECTURE:  Lights-out was at 21:49: and lights-on at  05:18:, with a total recording time of 7 hours, 29 min. Total sleep time ( TST) was 408.0 minutes with a normal sleep efficiency at 90.9%.  There was  8.8% REM sleep.  BODY POSITION:  TST was divided  between the following sleep positions: 23.9% supine;  76.1% lateral;  0% prone. Duration of total sleep and percent of total sleep in their respective position is as follows: supine 97 minutes (24%), non-supine 311 minutes (76%); right 310 minutes (76%), left 00 minutes (0%), and prone 00 minutes (0%).  Total supine REM sleep time was 09 minutes (25% of total REM sleep).  Sleep latency was normal at 5.0 minutes.  REM sleep latency was increased at 182.5 minutes. Of the total sleep time, the percentage of stage N1 sleep was 4.0%, stage N2 sleep was 80%, which is markedly increased, stage N3 sleep was 7.5%, and REM sleep was 8.8%, which is reduced. Wake after sleep onset (WASO) time accounted for 36 minutes with intermittent, minimal sleep fragmentation noted.   RESPIRATORY MONITORING:   Based on CMS criteria (using a 4% oxygen desaturation rule for scoring hypopneas), there were 13 apneas (13 obstructive; 0 central; 0 mixed), and 51 hypopneas.  Apnea index was 1.9. Hypopnea index was 7.5. The apnea-hypopnea index was 9.4/hour overall (25.2 supine, 22 non-supine; 25.0 REM, 33.3 supine REM).  There were 0 respiratory effort-related arousals (RERAs).  The RERA index was 0 events/h. Total respiratory disturbance index (RDI) was 9.4 events/h. RDI results showed: supine RDI  25.2 /h; non-supine RDI 4.4 /h; REM RDI 25.0 /h, supine REM RDI 33.3 /h.   Based on AASM criteria (using a 3% oxygen desaturation and /or arousal rule for scoring hypopneas), there were 13 apneas (13 obstructive;  0 central; 0 mixed), and 75 hypopneas. Apnea index was 1.9. Hypopnea index was 11.0. The apnea-hypopnea index was 12.9 overall (36.9 supine, 27 non-supine; 28.3 REM, 33.3 supine REM).  There were 0 respiratory effort-related arousals (RERAs).  The RERA index was 0 events/h. Total respiratory disturbance index (RDI) was 12.9 events/h. RDI results showed: supine RDI  36.9  /h; non-supine RDI 5.4 /h; REM RDI 28.3 /h, supine REM RDI 33.3 /h.    OXIMETRY: Oxyhemoglobin Saturation Nadir during sleep was at  82% from a mean of 96%.  Of the Total sleep time (TST)   hypoxemia (=<88%) was present for  6.5 minutes, or 1.6% of total sleep time.  LIMB MOVEMENTS: There were 18 periodic limb movements of sleep (2.6/hr), of which 1 (0.1/hr) were associated with an arousal.   AROUSAL: There were 30 arousals in total, for an arousal index of 4 arousals/hour.  Of these, 17 were identified as respiratory-related arousals (3 /h), 1 were PLM-related arousals (0 /h), and 14 were non-specific arousals (2 /h).   EEG: Review of the EEG showed no abnormal electrical discharges and symmetrical bihemispheric findings.    EKG: The EKG revealed normal sinus rhythm (NSR). The average heart rate during sleep was 77 bpm.   AUDIO/VIDEO REVIEW: The audio and video review did not show any abnormal or unusual behaviors, movements, phonations or vocalizations. The patient took 2 restroom breaks. Snoring was noted, intermittent, in the mild to moderate range.  POST-STUDY QUESTIONNAIRE: Post study, the patient indicated, that sleep was the same as usual.   IMPRESSION:  1. Obstructive Sleep Apnea (OSA) 2. Dysfunctions associated with sleep stages or arousal from sleep  RECOMMENDATIONS:  1. This study demonstrates overall mild obstructive sleep apnea, more pronounced during supine and REM sleep, with a total AHI of 9.4/hour, REM AHI of 25/hour, supine AHI of 25.2/hour and O2 nadir of 82%. Given the patient's medical history and sleep related complaints, treatment with positive airway pressure is recommended; this can be achieved in the form of autoPAP. I would recommend a minimu pressure of 5 cm, maximum pressure of 11 cm, with EPR of 2, mask of choice, sized to fit. A full-night CPAP titration study would allow optimization of therapy if needed. Other treatment options may include avoidance of supine  sleep position along with weight loss, or the use of an oral appliance in selected patients. Please note, that untreated obstructive sleep apnea may carry additional perioperative morbidity. Patients with significant obstructive sleep apnea should receive perioperative PAP therapy and the surgeons and particularly the anesthesiologist should be informed of the diagnosis and the severity of the sleep disordered breathing. 2. This study shows minimal sleep fragmentation and abnormal sleep stage percentages; these are nonspecific findings and per se do not signify an intrinsic sleep disorder or a cause for the patient's sleep-related symptoms. Causes include (but are not limited to) the first night effect of the sleep study, circadian rhythm disturbances, medication effect or an underlying mood disorder or medical problem.  3. The patient should be cautioned not to drive, work at heights, or operate dangerous or heavy equipment when tired or sleepy. Review and reiteration of good sleep hygiene measures should be pursued with any patient. 4. The patient will be notified of the test results and advised to discuss further steps with her [facility] provider.    I certify that I have reviewed the entire raw data recording prior to the issuance of this report in accordance with the Standards of Accreditation of  the American Academy of Sleep Medicine (AASM).  True Mar, MD, PhD Medical Director, [facility]) Diplomat, ABPN (Neurology and Sleep)               Technical Report:   General Information  Name: Fendi, Meinhardt BMI: 61.82 Physician: True Mar, MD  ID: [id number] Height: 61.0 in Technician: Donnice Counts, RPSGT  Sex: Female Weight: 202.0 lb Record: [id number]  Age: 68 [[birth year]] Date: 11/17/2023    Medical & Medication History    I saw patient, Actis Marvis, is a referral from the [facility] in [place], [place] for evaluation of her sleep disorder, in particular,  concern for underlying obstructive sleep apnea. The patient is accompanied by her daughter (who joined at the end of the visit) today. Ms. Barron is a 68 year old female with an underlying medical history of diabetes, neuropathy, stroke with left-sided weakness, hyperlipidemia, hyperthyroidism, reflux disease, history of pancreatitis, and obesity, who reports snoring and excessive daytime somnolence. Her Epworth sleepiness score is 11 out of 24, fatigue severity score is 59 out of 63. She had a CPAP machine through the [facility] for years but had not used it. She was diagnosed with obstructive sleep apnea. She gave her CPAP machine back a couple of months ago but per daughter had not used it for many years. A compliance download was not available for my review today. Prior sleep study results are not available for my review today. I reviewed available [facility] records. She goes to bed around 10 or 11 but has trouble sleeping. She has a rise time around 5 AM. She drinks caffeine in the form of coffee, about 2 cups/day and some herbal tea. She does not currently drink any alcohol. She quit smoking in October 2023. She lives with her daughter, granddaughter and great grandchild. She reports that she had trouble tolerating the mask in the past. She has nocturia about 2-3 times per average night.  Aspirin, Lipitor, Calcium carbonate vitamin D, Vitamin D, Flexeril, Neurontin, Glucophage XR, Pamelor, Zofran , Ditropan Xl, Protonix , Tessalon , Plavix, Colace, Pepcid , Flonase , Norco, Claritin, Tapazole, Tamiflu , Deltasone , Phenergan , Promethazine , Zocor   Sleep Disorder      Comments   Patient arrived for a diagnostic polysomnogram. Procedure explained and all questions answered. paste setup without complications. Patient slept supine and right. Mild to moderate snoring was noted. Respiratory events observed, worse while supine and while in REM sleep. No obvious cardiac arrhythmias noted. PLMS observed. Two restroom visits.    Lights  out: 09:49:13 PM Lights on: 05:18:11 AM   Time Total Supine Side Prone Upright  Recording (TRT) 7h 29.68m 1h 52.32m 5h 36.48m 0h 0.21m 0h 0.52m  Sleep (TST) 6h 48.100m 1h 37.57m 5h 10.61m 0h 0.18m 0h 0.72m   Latency N1 N2 N3 REM Onset Per. Slp. Eff.  Actual 0h 0.34m 0h 3.54m 0h 26.60m 3h 2.37m 0h 5.62m 0h 5.35m 90.87%   Stg Dur Wake N1 N2 N3 REM  Total 41.0 16.5 325.0 30.5 36.0  Supine 15.0 8.0 72.0 8.5 9.0  Side 26.0 8.5 253.0 22.0 27.0  Prone 0.0 0.0 0.0 0.0 0.0  Upright 0.0 0.0 0.0 0.0 0.0   Stg % Wake N1 N2 N3 REM  Total 9.1 4.0 79.7 7.5 8.8  Supine 3.3 2.0 17.6 2.1 2.2  Side 5.8 2.1 62.0 5.4 6.6  Prone 0.0 0.0 0.0 0.0 0.0  Upright 0.0 0.0 0.0 0.0 0.0     Apnea Summary Sub Supine Side Prone Upright  Total 13 Total 13  9 4 0 0    REM 3 1 2  0 0    NREM 10 8 2  0 0  Obs 13 REM 3 1 2  0 0    NREM 10 8 2  0 0  Mix 0 REM 0 0 0 0 0    NREM 0 0 0 0 0  Cen 0 REM 0 0 0 0 0    NREM 0 0 0 0 0   Rera Summary Sub Supine Side Prone Upright  Total 0 Total 0 0 0 0 0    REM 0 0 0 0 0    NREM 0 0 0 0 0   Hypopnea Summary Sub Supine Side Prone Upright  Total 75 Total 75 51 24 0 0    REM 14 4 10  0 0    NREM 61 47 14 0 0   4% Hypopnea Summary Sub Supine Side Prone Upright  Total (4%) 51 Total 51 32 19 0 0    REM 12 4 8  0 0    NREM 39 28 11 0 0     AHI Total Obs Mix Cen  12.94 Apnea 1.91 1.91 0.00 0.00   Hypopnea 11.03 -- -- --  9.41 Hypopnea (4%) 7.50 -- -- --    Total Supine Side Prone Upright  Position AHI 12.94 36.92 5.41 0.00 0.00  REM AHI 28.33   NREM AHI 11.45   Position RDI 12.94 36.92 5.41 0.00 0.00  REM RDI 28.33   NREM RDI 11.45    4% Hypopnea Total Supine Side Prone Upright  Position AHI (4%) 9.41 25.23 4.44 0.00 0.00  REM AHI (4%) 25.00   NREM AHI (4%) 7.90   Position RDI (4%) 9.41 25.23 4.44 0.00 0.00  REM RDI (4%) 25.00   NREM RDI (4%) 7.90    Desaturation Information Threshold: 2% <100% <90% <80% <70% <60% <50% <40%  Supine 75.0 3.0 0.0 0.0 0.0 0.0 0.0  Side 82.0 11.0  0.0 0.0 0.0 0.0 0.0  Prone 0.0 0.0 0.0 0.0 0.0 0.0 0.0  Upright 0.0 0.0 0.0 0.0 0.0 0.0 0.0  Total 157.0 14.0 0.0 0.0 0.0 0.0 0.0  Index 21.3 1.9 0.0 0.0 0.0 0.0 0.0   Threshold: 3% <100% <90% <80% <70% <60% <50% <40%  Supine 58.0 3.0 0.0 0.0 0.0 0.0 0.0  Side 29.0 11.0 0.0 0.0 0.0 0.0 0.0  Prone 0.0 0.0 0.0 0.0 0.0 0.0 0.0  Upright 0.0 0.0 0.0 0.0 0.0 0.0 0.0  Total 87.0 14.0 0.0 0.0 0.0 0.0 0.0  Index 11.8 1.9 0.0 0.0 0.0 0.0 0.0   Threshold: 4% <100% <90% <80% <70% <60% <50% <40%  Supine 42.0 3.0 0.0 0.0 0.0 0.0 0.0  Side 23.0 11.0 0.0 0.0 0.0 0.0 0.0  Prone 0.0 0.0 0.0 0.0 0.0 0.0 0.0  Upright 0.0 0.0 0.0 0.0 0.0 0.0 0.0  Total 65.0 14.0 0.0 0.0 0.0 0.0 0.0  Index 8.8 1.9 0.0 0.0 0.0 0.0 0.0   Threshold: 3% <100% <90% <80% <70% <60% <50% <40%  Supine 58 3 0 0 0 0 0  Side 29 11 0 0 0 0 0  Prone 0 0 0 0 0 0 0  Upright 0 0 0 0 0 0 0  Total 87 14 0 0 0 0 0   Awakening/Arousal Information # of Awakenings 8  Wake after sleep onset 36.69m  Wake after persistent sleep 36.40m   Arousal Assoc. Arousals Index  Apneas 2 0.3  Hypopneas 16 2.4  Leg Movements 6 0.9  Snore 0 0.0  PTT Arousals 0 0.0  Spontaneous 14 2.1  Total 38 5.6  Leg Movement Information PLMS LMs Index  Total LMs during PLMS 18 2.6  LMs w/ Microarousals 1 0.1   LM LMs Index  w/ Microarousal 4 0.6  w/ Awakening 1 0.1  w/ Resp Event 1 0.1  Spontaneous 17 2.5  Total 22 3.2     Desaturation threshold setting: 3% Minimum desaturation setting: 10 seconds SaO2 nadir: 82% The longest event was a 43 sec obstructive Hypopnea with a minimum SaO2 of 94%. The lowest SaO2 was 82% associated with a 13 sec obstructive Hypopnea. EKG Rates EKG Avg Max Min  Awake 80 92 68  Asleep 77 92 65  EKG Events: Tachycardia

## 2023-11-24 NOTE — Telephone Encounter (Signed)
 I spoke to pt and relayed the results of the sleep test she had.  Recommended treatment for mild OSA to see if would make her feel better.  [facility] to treat.  She has appt tomorrow with another MD but will drop off the sleep test to her pcp.  She verbalized understanding and will send to debra in [facility] to get her the report.  She appreciated call back.

## 2023-11-24 NOTE — Telephone Encounter (Signed)
-----   Message from True Mar sent at 11/24/2023 10:39 AM EST ----- Patient referred by the [facility] in [place], seen by me on 08/20/2023, patient had HST on 11/17/2023.    Please call and notify the patient that the recent home sleep test showed obstructive sleep apnea. OSA is overall mild, but worth treating to see if she feels better after treatment.  Overall, she slept fairly well, achieved all stages of sleep but mostly slept in light stages of sleep without major interruptions and intermittent mild to moderate snoring noted.  Please advise patient that further treatment and next steps should be discussed with the [facility] provider.  I have detailed recommendations in my report.  Please advise patient that she should arrange for a follow-up with her [facility] provider to discuss next steps.  If she needs to pick up her report from us , we can facilitate through our medical records department.  Thanks,   True Mar, MD, PhD [facility])

## 2024-03-14 ENCOUNTER — Ambulatory Visit (HOSPITAL_COMMUNITY): Admission: EM | Admit: 2024-03-14 | Discharge: 2024-03-14 | Disposition: A | Discharge status: Home / Self Care

## 2024-03-14 ENCOUNTER — Telehealth (HOSPITAL_COMMUNITY): Payer: Self-pay | Admitting: Emergency Medicine

## 2024-03-14 ENCOUNTER — Other Ambulatory Visit: Payer: Self-pay

## 2024-03-14 ENCOUNTER — Encounter (HOSPITAL_COMMUNITY): Payer: Self-pay | Admitting: Emergency Medicine

## 2024-03-14 DIAGNOSIS — G9331 Postviral fatigue syndrome: Secondary | ICD-10-CM

## 2024-03-14 HISTORY — DX: Cerebral infarction, unspecified: I63.9

## 2024-03-14 LAB — POCT RAPID STREP A (OFFICE): Rapid Strep A Screen: NEGATIVE

## 2024-03-14 MED ORDER — AZELASTINE HCL 0.1 % NA SOLN: 1.0000 | Freq: Two times a day (BID) | NASAL | 1 refills | Status: DC

## 2024-03-14 MED ORDER — AZELASTINE HCL 0.1 % NA SOLN: 1.0000 | Freq: Two times a day (BID) | NASAL | 1 refills | Status: AC

## 2024-03-14 MED ORDER — IBUPROFEN 600 MG PO TABS: 600.0000 mg | ORAL_TABLET | Freq: Four times a day (QID) | ORAL | 0 refills | Status: DC | PRN

## 2024-03-14 MED ORDER — IBUPROFEN 600 MG PO TABS: 600.0000 mg | ORAL_TABLET | Freq: Four times a day (QID) | ORAL | 0 refills | Status: AC | PRN

## 2024-03-14 NOTE — ED Notes (Signed)
 Family member ( daughter ) in car waiting for patient  Changed pharmacy per daughters request-briefly spoke on phone with daughter in regards to preferred pharmacy

## 2024-03-14 NOTE — ED Provider Notes (Signed)
 [facility]  Note:  This document was prepared using Dragon voice recognition software and may include unintentional dictation errors.  MRN: [id number] DOB: 1956/03/06  Subjective:   Selena Long is a 68 y.o. female presenting for sore throat, headache, chills, body aches, fatigue x 1 week.  Patient reports that she has taken some Tylenol  for her headache with no improvement.  Patient states that she did have a cough but is now subsided, sore throat has also slightly improved.  Patient denies any known sick contacts.  States that she had a stroke last year and was concerned because she just did not feel well.  No shortness of breath, chest pain, weakness, dizziness.  No current facility-administered medications for this encounter.  Current Outpatient Medications:    azelastine (ASTELIN) 0.1 % nasal spray, Place 1 spray into both nostrils 2 (two) times daily. Use in each nostril as directed, Disp: 30 mL, Rfl: 1   ibuprofen  (ADVIL ) 600 MG tablet, Take 1 tablet (600 mg total) by mouth every 6 (six) hours as needed., Disp: 30 tablet, Rfl: 0   aspirin 81 MG chewable tablet, Chew 81 mg by mouth daily., Disp: , Rfl:    atorvastatin (LIPITOR) 40 MG tablet, Take 40 mg by mouth at bedtime., Disp: , Rfl:    Calcium Carbonate-Vitamin D (CALCIUM 600+D HIGH POTENCY) 600-400 MG-UNIT tablet, Take 1 tablet by mouth daily., Disp: , Rfl:    cetirizine (ZYRTEC) 10 MG tablet, Take 10 mg by mouth daily as needed for allergies., Disp: , Rfl:    cholecalciferol (VITAMIN D) 1000 units tablet, Take 1,000 Units by mouth daily., Disp: , Rfl:    clopidogrel (PLAVIX) 75 MG tablet, Take 75 mg by mouth daily., Disp: , Rfl:    cyclobenzaprine (FLEXERIL) 10 MG tablet, Take 10 mg by mouth 3 (three) times daily as needed for muscle spasms., Disp: , Rfl:    docusate sodium (COLACE) 50 MG capsule, Take 50 mg by mouth 2 (two) times daily., Disp: , Rfl:    famotidine  (PEPCID ) 20 MG tablet, Take 1 tablet (20  mg total) by mouth 2 (two) times daily., Disp: 30 tablet, Rfl: 0   gabapentin (NEURONTIN) 800 MG tablet, Take 800 mg by mouth 3 (three) times daily., Disp: , Rfl:    loratadine (CLARITIN) 10 MG tablet, Take 10 mg by mouth daily., Disp: , Rfl:    metFORMIN (GLUCOPHAGE-XR) 500 MG 24 hr tablet, Take 500 mg by mouth daily with breakfast., Disp: , Rfl:    methimazole (TAPAZOLE) 5 MG tablet, Take 5 mg by mouth daily., Disp: , Rfl:    nortriptyline (PAMELOR) 10 MG capsule, Take 10 mg by mouth at bedtime., Disp: , Rfl:    ondansetron  (ZOFRAN  ODT) 4 MG disintegrating tablet, Take 1 tablet (4 mg total) by mouth every 8 (eight) hours as needed for nausea or vomiting., Disp: 20 tablet, Rfl: 0   ondansetron  (ZOFRAN ) 4 MG tablet, Take 1 tablet (4 mg total) by mouth every 8 (eight) hours as needed for nausea or vomiting., Disp: 15 tablet, Rfl: 0   oxybutynin (DITROPAN-XL) 10 MG 24 hr tablet, Take 10 mg by mouth at bedtime., Disp: , Rfl:    pantoprazole  (PROTONIX ) 40 MG tablet, Take 40 mg by mouth every morning., Disp: , Rfl:    promethazine  (PHENERGAN ) 25 MG tablet, Take 1 tablet (25 mg total) by mouth every 6 (six) hours as needed for nausea or vomiting., Disp: 30 tablet, Rfl: 0   simvastatin (ZOCOR) 20 MG  tablet, Take 20 mg by mouth at bedtime., Disp: , Rfl:    Allergies  Allergen Reactions   Codeine     unknown   Penicillins Hives    PT DENIES    Past Medical History:  Diagnosis Date   Acid reflux    Diabetes mellitus without complication (HCC)    High cholesterol    Hyperthyroidism    Neuropathy    Pancreatitis    Stroke [facility])      Past Surgical History:  Procedure Laterality Date   ABDOMINAL HYSTERECTOMY     ABDOMINAL SURGERY     TONSILLECTOMY      Family History  Problem Relation Age of Onset   Sleep apnea Mother     Social History   Tobacco Use   Smoking status: Never   Smokeless tobacco: Never  Vaping Use   Vaping status: Never Used  Substance Use Topics   Alcohol use:  Not Currently    Comment: occ   Drug use: Not Currently    Types: Marijuana    ROS Refer to HPI for ROS details.  Objective:   Vitals: BP 115/76 (BP Location: Right Arm) Comment (BP Location): large cuff  Pulse 95   Temp 98.3 F (36.8 C) (Oral)   Resp 20   SpO2 98%   Physical Exam Vitals and nursing note reviewed.  Constitutional:      General: She is not in acute distress.    Appearance: Normal appearance. She is well-developed. She is not ill-appearing or toxic-appearing.  HENT:     Head: Normocephalic and atraumatic.     Nose: Mucosal edema, congestion and rhinorrhea present. No nasal tenderness.     Right Sinus: No maxillary sinus tenderness or frontal sinus tenderness.     Left Sinus: No maxillary sinus tenderness or frontal sinus tenderness.     Mouth/Throat:     Mouth: Mucous membranes are moist.     Pharynx: Oropharynx is clear. Postnasal drip present. No oropharyngeal exudate or posterior oropharyngeal erythema.  Cardiovascular:     Rate and Rhythm: Normal rate and regular rhythm.     Heart sounds: Normal heart sounds. No murmur heard. Pulmonary:     Effort: Pulmonary effort is normal. No respiratory distress.     Breath sounds: Normal breath sounds. No stridor. No wheezing, rhonchi or rales.  Skin:    General: Skin is warm and dry.  Neurological:     General: No focal deficit present.     Mental Status: She is alert and oriented to person, place, and time.     Cranial Nerves: No cranial nerve deficit.     Sensory: No sensory deficit.     Motor: No weakness.  Psychiatric:        Mood and Affect: Mood normal.        Behavior: Behavior normal.        Thought Content: Thought content normal.        Judgment: Judgment normal.     Procedures  Results for orders placed or performed during the hospital encounter of 03/14/24 (from the past 24 hours)  POC rapid strep A     Status: None   Collection Time: 03/14/24  1:10 PM  Result Value Ref Range   Rapid  Strep A Screen Negative Negative    No results found.   Assessment and Plan :     Discharge Instructions       1. Postviral syndrome (Primary) - POC rapid strep A completed  in UC is negative for strep pharyngitis. - azelastine (ASTELIN) 0.1 % nasal spray; Place 1 spray into both nostrils 2 (two) times daily. Use in each nostril as directed  Dispense: 30 mL; Refill: 1 - ibuprofen  (ADVIL ) 600 MG tablet; Take 1 tablet (600 mg total) by mouth every 6 (six) hours as needed.  Dispense: 30 tablet; Refill: 0 - Take daily cetirizine 10 mg as directed for nasal sinus allergy symptoms. -Continue to monitor symptoms for any change in severity if there is any escalation of current symptoms or development of new symptoms follow-up in ER for further evaluation and management.      Azelia Reiger B Albirta Rhinehart   Terease Marcotte B, NP 03/14/24 1330

## 2024-03-14 NOTE — Discharge Instructions (Signed)
  1. Postviral syndrome (Primary) - POC rapid strep A completed in UC is negative for strep pharyngitis. - azelastine (ASTELIN) 0.1 % nasal spray; Place 1 spray into both nostrils 2 (two) times daily. Use in each nostril as directed  Dispense: 30 mL; Refill: 1 - ibuprofen  (ADVIL ) 600 MG tablet; Take 1 tablet (600 mg total) by mouth every 6 (six) hours as needed.  Dispense: 30 tablet; Refill: 0 - Take daily cetirizine 10 mg as directed for nasal sinus allergy symptoms. -Continue to monitor symptoms for any change in severity if there is any escalation of current symptoms or development of new symptoms follow-up in ER for further evaluation and management.

## 2024-03-14 NOTE — ED Triage Notes (Signed)
 Sore throat, headache, chills for one week.  Complains of body aches, headache  Tylenol  for headache  Reports a stroke last year
